# Patient Record
Sex: Female | Born: 1950 | Race: White | Hispanic: No | Smoking: Former smoker
Health system: Southern US, Community
[De-identification: ages and names within clinical notes are randomized; demographics above are authoritative.]

## PROBLEM LIST (undated history)

## (undated) DIAGNOSIS — G43909 Migraine, unspecified, not intractable, without status migrainosus: Secondary | ICD-10-CM

## (undated) DIAGNOSIS — J189 Pneumonia, unspecified organism: Secondary | ICD-10-CM

## (undated) DIAGNOSIS — M519 Unspecified thoracic, thoracolumbar and lumbosacral intervertebral disc disorder: Secondary | ICD-10-CM

## (undated) DIAGNOSIS — E785 Hyperlipidemia, unspecified: Secondary | ICD-10-CM

## (undated) DIAGNOSIS — E039 Hypothyroidism, unspecified: Secondary | ICD-10-CM

## (undated) DIAGNOSIS — E079 Disorder of thyroid, unspecified: Secondary | ICD-10-CM

## (undated) DIAGNOSIS — E063 Autoimmune thyroiditis: Secondary | ICD-10-CM

## (undated) DIAGNOSIS — D649 Anemia, unspecified: Secondary | ICD-10-CM

## (undated) HISTORY — PX: BREAST BIOPSY: SHX20

## (undated) HISTORY — DX: Disorder of thyroid, unspecified: E07.9

## (undated) HISTORY — PX: DIAGNOSTIC LAPAROSCOPY: SUR761

## (undated) HISTORY — DX: Hyperlipidemia, unspecified: E78.5

## (undated) HISTORY — PX: BREAST EXCISIONAL BIOPSY: SUR124

## (undated) HISTORY — DX: Autoimmune thyroiditis: E06.3

## (undated) HISTORY — PX: COLONOSCOPY: SHX5424

---

## 1995-09-07 HISTORY — PX: OTHER SURGICAL HISTORY: SHX169

## 2002-12-27 ENCOUNTER — Encounter: Payer: Self-pay | Admitting: Family Medicine

## 2002-12-27 ENCOUNTER — Ambulatory Visit (HOSPITAL_COMMUNITY): Admission: RE | Admit: 2002-12-27 | Discharge: 2002-12-27 | Payer: Self-pay | Admitting: Family Medicine

## 2003-09-12 ENCOUNTER — Other Ambulatory Visit: Admission: RE | Admit: 2003-09-12 | Discharge: 2003-09-12 | Payer: Self-pay | Admitting: *Deleted

## 2003-09-30 ENCOUNTER — Ambulatory Visit (HOSPITAL_COMMUNITY): Admission: RE | Admit: 2003-09-30 | Discharge: 2003-09-30 | Payer: Self-pay | Admitting: Neurology

## 2008-12-10 ENCOUNTER — Encounter: Admission: RE | Admit: 2008-12-10 | Discharge: 2008-12-10 | Payer: Self-pay | Admitting: Neurology

## 2010-01-20 ENCOUNTER — Encounter: Admission: RE | Admit: 2010-01-20 | Discharge: 2010-01-20 | Payer: Self-pay | Admitting: Family Medicine

## 2012-03-02 ENCOUNTER — Encounter: Payer: Self-pay | Admitting: Internal Medicine

## 2012-03-02 ENCOUNTER — Ambulatory Visit (INDEPENDENT_AMBULATORY_CARE_PROVIDER_SITE_OTHER): Payer: 59 | Admitting: Internal Medicine

## 2012-03-02 VITALS — BP 108/62 | HR 63 | Temp 98.4°F | Ht 62.25 in | Wt 162.0 lb

## 2012-03-02 DIAGNOSIS — E785 Hyperlipidemia, unspecified: Secondary | ICD-10-CM

## 2012-03-02 DIAGNOSIS — Z8669 Personal history of other diseases of the nervous system and sense organs: Secondary | ICD-10-CM

## 2012-03-02 DIAGNOSIS — J309 Allergic rhinitis, unspecified: Secondary | ICD-10-CM | POA: Insufficient documentation

## 2012-03-02 DIAGNOSIS — E039 Hypothyroidism, unspecified: Secondary | ICD-10-CM | POA: Insufficient documentation

## 2012-03-02 MED ORDER — PROPRANOLOL HCL ER BEADS 80 MG PO CP24: 80.0000 mg | ORAL_CAPSULE | Freq: Every day | ORAL | Status: DC

## 2012-03-02 MED ORDER — LEVOTHYROXINE SODIUM 112 MCG PO TABS: 112.0000 ug | ORAL_TABLET | Freq: Every day | ORAL | Status: DC

## 2012-03-02 MED ORDER — FROVATRIPTAN SUCCINATE 2.5 MG PO TABS: 2.5000 mg | ORAL_TABLET | ORAL | Status: DC | PRN

## 2012-03-02 NOTE — Progress Notes (Signed)
  Subjective:    Patient ID: Nancy Anthony, female    DOB: [birth year], 61 y.o.   MRN: [id number]  HPI  61 year old patient who is seen today to establish with this practice. She has changed insurance plans and her former physician, Dr. Laurann Montana no longer participates in her plan. She's had a recent physical exam with lab.   Medical problems include hypothyroidism. Cytomel has recent added to her present regimen due to an apparent slightly low free T3. In spite of a normal TSH she still has some cold intolerance dry skin and fatigue. She had Hashimoto's thyroiditis at age 41. She also has a history of dyslipidemia;  she has tried both Lipitor and simvastatin in the past. She feels that this has resulted in chronic muscle fatigue She is followed by Dr. Vela Prose for migraine headaches. These have improved since postmenopausal. Past surgical history is unremarkable did have a breast biopsy 1979. Did have outpatient surgery to remove a cyst in the left preauricular area in the past Social history married 45 year old daughter. Nonsmoker former Runner, broadcasting/film/video presently retired Family history father died age 6 of an MI paternal grandfather lived to his 68s mother recent died age 78 of complications of senile dementia paternal grandfather lived to 47 And these has had breast cancer one brother died of complications of prostate cancer area of he also had hypercholesterolemia and carotid artery disease    Review of Systems  Constitutional: Negative for fever, appetite change, fatigue and unexpected weight change.  HENT: Negative for hearing loss, ear pain, nosebleeds, congestion, sore throat, mouth sores, trouble swallowing, neck stiffness, dental problem, voice change, sinus pressure and tinnitus.   Eyes: Negative for photophobia, pain, redness and visual disturbance.  Respiratory: Negative for cough, chest tightness and shortness of breath.   Cardiovascular: Negative for chest pain, palpitations and leg  swelling.  Gastrointestinal: Negative for nausea, vomiting, abdominal pain, diarrhea, constipation, blood in stool, abdominal distention and rectal pain.  Genitourinary: Negative for dysuria, urgency, frequency, hematuria, flank pain, vaginal bleeding, vaginal discharge, difficulty urinating, genital sores, vaginal pain, menstrual problem and pelvic pain.  Musculoskeletal: Positive for myalgias. Negative for back pain and arthralgias.  Skin: Negative for rash.  Neurological: Positive for headaches. Negative for dizziness, syncope, speech difficulty, weakness, light-headedness and numbness.  Hematological: Negative for adenopathy. Does not bruise/bleed easily.  Psychiatric/Behavioral: Negative for suicidal ideas, behavioral problems, self-injury, dysphoric mood and agitation. The patient is not nervous/anxious.        Objective:   Physical Exam  Constitutional: She appears well-developed and well-nourished. No distress.       Slightly overweight with weight 162 Blood pressure 108/62  Clinical exam declines since she has had a recent exam by her former  PCP          Assessment & Plan:   Hypothyroidism. Clinically improved on combination therapy History dyslipidemia Migraine headaches stable  Patient will return in one year or as needed. All medications refilled

## 2012-03-02 NOTE — Patient Instructions (Addendum)
It is important that you exercise regularly, at least 20 minutes 3 to 4 times per week.  If you develop chest pain or shortness of breath seek  medical attention.  Return in one year for follow-up   

## 2012-04-20 ENCOUNTER — Other Ambulatory Visit: Payer: Self-pay

## 2012-04-20 MED ORDER — LIOTHYRONINE SODIUM 5 MCG PO TABS: 5.0000 ug | ORAL_TABLET | Freq: Every day | ORAL | Status: DC

## 2012-10-11 ENCOUNTER — Other Ambulatory Visit: Payer: Self-pay | Admitting: Internal Medicine

## 2013-03-23 ENCOUNTER — Other Ambulatory Visit: Payer: Self-pay | Admitting: Internal Medicine

## 2013-04-19 ENCOUNTER — Other Ambulatory Visit: Payer: Self-pay | Admitting: *Deleted

## 2013-04-19 MED ORDER — LIOTHYRONINE SODIUM 5 MCG PO TABS: ORAL_TABLET | ORAL | Status: DC

## 2013-05-30 ENCOUNTER — Other Ambulatory Visit: Payer: Self-pay | Admitting: Internal Medicine

## 2013-07-02 ENCOUNTER — Encounter: Payer: Self-pay | Admitting: Internal Medicine

## 2013-07-02 ENCOUNTER — Ambulatory Visit (INDEPENDENT_AMBULATORY_CARE_PROVIDER_SITE_OTHER): Payer: BC Managed Care – PPO | Admitting: Internal Medicine

## 2013-07-02 VITALS — BP 122/68 | HR 61 | Temp 98.4°F | Resp 10 | Ht 62.5 in | Wt 172.6 lb

## 2013-07-02 DIAGNOSIS — E063 Autoimmune thyroiditis: Secondary | ICD-10-CM

## 2013-07-02 LAB — T3, FREE: T3, Free: 2.3 pg/mL (ref 2.3–4.2)

## 2013-07-02 LAB — T4, FREE: Free T4: 1.28 ng/dL (ref 0.60–1.60)

## 2013-07-02 NOTE — Patient Instructions (Signed)
Please return in 6 months. Stop at the lab. If results abnormal >> return in 6 weeks to recheck tests.  Please join MyChart.

## 2013-07-02 NOTE — Progress Notes (Signed)
Patient ID: Nancy Anthony, female   DOB: 10-12-1950, 62 y.o.   MRN: [id number]   HPI  Nancy Anthony is a 62 y.o.-year-old female, referred by her PCP, Dr.Kwiatkowski, for evaluation for hypothyroidism.  Pt. has been dx with hypothyroidism in 1968; initially started on Synthroid (brand name) at 300 mg >> then decreased to 250 mcg daily, then slowly decreased to 112 mcg x 10 years, but last 2 years >> cold intolerance, fatigue, despite normal tests. She used Cytomel x 1 year (did not help) >> stopped 6 weeks ago. Per Dr Vernon Prey note, this was started 2/2 a slightly low free T3.  She takes the Synthroid >> at night, with water, >2-3h after dinner (no after dinner snacks). No iron, calcium, PPI, MVI.  Pt did not have thyroid tests for 1.5 year, but they have been normal.   Pt denies feeling nodules in neck, hoarseness, dysphagia/odynophagia, SOB with lying down;   She c/o: - + fatigue - + weight gain: last 2 years - 10 lbs - + some constipation - + dry skin - no hair falling - no depression or anxiety - + HAs -   She has a + FH of thyroid disorders in: mother, father and many other family members. No FH of thyroid cancer. No h/o radiation tx to head or neck.  I reviewed her chart and she also has a history of migraines - on Triptan, Propranolol  ROS: Constitutional: see HPI, also hot flushes Eyes: no blurry vision, no xerophthalmia ENT: no sore throat, no nodules palpated in throat, no dysphagia/odynophagia, no hoarseness Cardiovascular: no CP/SOB/palpitations/leg swelling Respiratory: no cough/SOB Gastrointestinal: no N/V/D/C Musculoskeletal: no muscle/+ joint aches (hip, shoulder) Skin: no rashes, + itching Neurological: no tremors/numbness/tingling/dizziness, + HA Psychiatric: no depression/anxiety  Past Medical History  Diagnosis Date  . Thyroid disease   . Hyperlipidemia    Past Surgical History  Procedure Laterality Date  . Breast biopsy     History    Social History  . Marital Status: Married    Spouse Name: N/A    Number of Children: 1   Occupational History  . Retired Runner, broadcasting/film/video   Social History Main Topics  . Smoking status: Former Smoker    Types: Cigarettes  . Smokeless tobacco: Never Used  . Alcohol Use: No  . Drug Use: No  . Sexual Activity: Yes    Partners: Male   Social History Narrative   Regular exercise: no   Caffeine use: cup of coffee in them AM   Current Outpatient Prescriptions on File Prior to Visit  Medication Sig Dispense Refill  . aspirin 81 MG tablet Take 81 mg by mouth daily.      . frovatriptan (FROVA) 2.5 MG tablet Take 1 tablet (2.5 mg total) by mouth as needed. If recurs, may repeat after 2 hours. Max of 3 tabs in 24 hours.  10 tablet  6  . SYNTHROID 112 MCG tablet TAKE ONE TABLET BY MOUTH EVERY DAY  90 tablet  0  . liothyronine (CYTOMEL) 5 MCG tablet TAKE ONE TABLET BY MOUTH EVERY DAY  30 tablet  0   No current facility-administered medications on file prior to visit.   Allergies  Allergen Reactions  . Codeine     Headache, shakes & vomiting  . Statins     Muscle weakness   History reviewed. No pertinent family history.  PE: BP 122/68  Pulse 61  Temp(Src) 98.4 F (36.9 C) (Oral)  Resp 10  Ht 5' 2.5" (1.588  m)  Wt 172 lb 9.6 oz (78.291 kg)  BMI 31.05 kg/m2  SpO2 95% Wt Readings from Last 3 Encounters:  07/02/13 172 lb 9.6 oz (78.291 kg)  03/02/12 162 lb (73.483 kg)   Constitutional: overweight, in NAD Eyes: PERRLA, EOMI, no exophthalmos ENT: moist mucous membranes, no thyromegaly, no cervical lymphadenopathy Cardiovascular: RRR, No MRG Respiratory: CTA B Gastrointestinal: abdomen soft, NT, ND, BS+ Musculoskeletal: no deformities, strength intact in all 4 Skin: moist, warm, no rashes Neurological: no tremor with outstretched hands, DTR normal in all 4  ASSESSMENT: 1. Hypothyroidism - on Synthroid 112 mcg taken at night  PLAN:  1. Patient with long-standing  hypothyroidism, on Synthroid therapy. She appears euthyroid. - I explained that fatigue, weight gain and dry skin are nonspecific sxs and they can be seen in many other conditions. If TSh normal, would continue the same dose of Synthroid.  - She tried Cytomel low dose >> did not help. This was apparently started for a low fT3, but we discussed that Propranolol can decrease the T3 >> she will see if maybe the dose can be decreased (as the beta blocker can cause fatigue, weight gain and also the lower T3). - Upon her questioning, we discussed about positive and negative aspects of using Armour thyroid. I underlined the fact that:  Armour is purified from porcine thyroid glands, which is not without risk for contaminants  Also, the ratio between T4 and T3 in Armour is physiologic for pigs, not for humans.  The short half life of T3 can cause fluctuations in blood levels, which can result in mood swings and heart rhythm abnormalities.  The concentration of the active substances (T4 and T3) can be expected to vary between different Armour lots, which can cause variation in the thyroid function tests.  - We discussed about correct intake of levothyroxine, fasting, with water, separated by at least 30 minutes from a meal. It is OK to take it at night if she takes it long enough after dinner. - She does not appear to have a goiter, thyroid nodules, or neck compression symptoms - we'll check thyroid tests today: TSH, free T4, free t3 - If these are abnormal, she will need to return in 6-8 weeks for repeat labs - If these are normal, I will see her back in 6 months  Office Visit on 07/02/2013  Component Date Value Range Status  . TSH 07/02/2013 0.20* 0.35 - 5.50 uIU/mL Final  . T3, Free 07/02/2013 2.3  2.3 - 4.2 pg/mL Final  . Free T4 07/02/2013 1.28  0.60 - 1.60 ng/dL Final   Since we moved the Synthroid in am >> will continue current dose for now and recheck tests in 6 weeks >> if TSH still low >> will  decrease Synthroid dose.

## 2013-07-12 ENCOUNTER — Other Ambulatory Visit: Payer: Self-pay

## 2013-09-04 ENCOUNTER — Other Ambulatory Visit: Payer: Self-pay | Admitting: Internal Medicine

## 2013-09-04 ENCOUNTER — Other Ambulatory Visit (INDEPENDENT_AMBULATORY_CARE_PROVIDER_SITE_OTHER): Payer: BC Managed Care – PPO

## 2013-09-04 DIAGNOSIS — E039 Hypothyroidism, unspecified: Secondary | ICD-10-CM

## 2013-09-04 DIAGNOSIS — E063 Autoimmune thyroiditis: Secondary | ICD-10-CM

## 2013-09-04 LAB — TSH: TSH: 0.14 u[IU]/mL — ABNORMAL LOW (ref 0.35–5.50)

## 2013-09-04 LAB — T4, FREE: Free T4: 1.02 ng/dL (ref 0.60–1.60)

## 2013-09-04 MED ORDER — SYNTHROID 100 MCG PO TABS: 100.0000 ug | ORAL_TABLET | Freq: Every day | ORAL | Status: DC

## 2014-05-21 ENCOUNTER — Other Ambulatory Visit: Payer: Self-pay | Admitting: *Deleted

## 2014-05-21 MED ORDER — SYNTHROID 100 MCG PO TABS: 100.0000 ug | ORAL_TABLET | Freq: Every day | ORAL | Status: DC

## 2014-05-29 ENCOUNTER — Emergency Department (HOSPITAL_COMMUNITY)
Admission: EM | Admit: 2014-05-29 | Discharge: 2014-05-30 | Disposition: A | Discharge status: Home / Self Care | Payer: BC Managed Care – PPO | Attending: Emergency Medicine | Admitting: Emergency Medicine

## 2014-05-29 ENCOUNTER — Encounter (HOSPITAL_COMMUNITY): Payer: Self-pay | Admitting: Emergency Medicine

## 2014-05-29 DIAGNOSIS — S6990XA Unspecified injury of unspecified wrist, hand and finger(s), initial encounter: Secondary | ICD-10-CM | POA: Diagnosis present

## 2014-05-29 DIAGNOSIS — Z79899 Other long term (current) drug therapy: Secondary | ICD-10-CM | POA: Insufficient documentation

## 2014-05-29 DIAGNOSIS — Z7982 Long term (current) use of aspirin: Secondary | ICD-10-CM | POA: Diagnosis not present

## 2014-05-29 DIAGNOSIS — E079 Disorder of thyroid, unspecified: Secondary | ICD-10-CM | POA: Insufficient documentation

## 2014-05-29 DIAGNOSIS — S61209A Unspecified open wound of unspecified finger without damage to nail, initial encounter: Secondary | ICD-10-CM | POA: Diagnosis not present

## 2014-05-29 DIAGNOSIS — Y9389 Activity, other specified: Secondary | ICD-10-CM | POA: Diagnosis not present

## 2014-05-29 DIAGNOSIS — Z87891 Personal history of nicotine dependence: Secondary | ICD-10-CM | POA: Insufficient documentation

## 2014-05-29 DIAGNOSIS — S6980XA Other specified injuries of unspecified wrist, hand and finger(s), initial encounter: Secondary | ICD-10-CM | POA: Insufficient documentation

## 2014-05-29 DIAGNOSIS — G43909 Migraine, unspecified, not intractable, without status migrainosus: Secondary | ICD-10-CM | POA: Insufficient documentation

## 2014-05-29 DIAGNOSIS — Y9289 Other specified places as the place of occurrence of the external cause: Secondary | ICD-10-CM | POA: Insufficient documentation

## 2014-05-29 DIAGNOSIS — W230XXA Caught, crushed, jammed, or pinched between moving objects, initial encounter: Secondary | ICD-10-CM | POA: Insufficient documentation

## 2014-05-29 DIAGNOSIS — S61213A Laceration without foreign body of left middle finger without damage to nail, initial encounter: Secondary | ICD-10-CM

## 2014-05-29 HISTORY — DX: Migraine, unspecified, not intractable, without status migrainosus: G43.909

## 2014-05-29 NOTE — ED Notes (Signed)
Pt. presents with laceration at left distal middle finger approx. 1/2 inch sustained this evening at home , accidentally caught it between sliding door . Dressing applied at triage .

## 2014-05-30 MED ORDER — LIDOCAINE HCL (PF) 1 % IJ SOLN
5.0000 mL | Freq: Once | INTRAMUSCULAR | Status: DC
  Filled 2014-05-30: qty 5

## 2014-05-30 NOTE — ED Provider Notes (Signed)
Medical screening examination/treatment/procedure(s) were performed by non-physician practitioner and as supervising physician I was immediately available for consultation/collaboration.  Ernestina Patches, MD 05/30/14 2013

## 2014-05-30 NOTE — Discharge Instructions (Signed)
You may remove your bandage in 48 hours.  At that point, area can be gently cleaned with soap and water.  Avoid picking at wound glue. Do not soak or submerge finger in water such as a bath or while doing dishes. See below for further instructions.

## 2014-05-30 NOTE — ED Provider Notes (Signed)
CSN: [id number]     Arrival date & time 05/29/14  2320 History   First MD Initiated Contact with Patient 05/30/14 0000     Chief Complaint  Patient presents with  . Finger Injury     (Consider location/radiation/quality/duration/timing/severity/associated sxs/prior Treatment) HPI Pt is a 63yo female presenting to ED with c/o laceration to the distal aspect of her left middle finger, 1cm long.  Reports getting finger caught between sliding door which resulted in immediate pain and bleeding. Pain is aching, 8/10, worse with movement.  Bleeding controlled with pressure. Denies pain or damage to nail.  Reports last tetanus was 4-5 years ago.  Pt is not on blood thinners. No hx of diabetes. Denies any other injuries.    Past Medical History  Diagnosis Date  . Thyroid disease   . Hyperlipidemia   . Migraine    Past Surgical History  Procedure Laterality Date  . Breast biopsy     No family history on file. History  Substance Use Topics  . Smoking status: Former Smoker    Types: Cigarettes  . Smokeless tobacco: Never Used  . Alcohol Use: No   OB History   Grav Para Term Preterm Abortions TAB SAB Ect Mult Living                 Review of Systems  Musculoskeletal: Negative for arthralgias, joint swelling and myalgias.  Skin: Positive for wound ( distal left middle finger). Negative for color change.  All other systems reviewed and are negative.     Allergies  Codeine and Statins  Home Medications   Prior to Admission medications   Medication Sig Start Date End Date Taking? Authorizing Provider  aspirin 81 MG tablet Take 81 mg by mouth daily.    Historical Provider, MD  frovatriptan (FROVA) 2.5 MG tablet Take 1 tablet (2.5 mg total) by mouth as needed. If recurs, may repeat after 2 hours. Max of 3 tabs in 24 hours. 03/02/12   Marletta Lor, MD  propranolol ER (INDERAL LA) 80 MG 24 hr capsule Take 80 mg by mouth daily.  06/15/13   Historical Provider, MD  SYNTHROID 100  MCG tablet Take 1 tablet (100 mcg total) by mouth daily before breakfast. 05/21/14   Philemon Kingdom, MD   BP 139/69  Pulse 66  Temp(Src) 98.8 F (37.1 C) (Oral)  Resp 20  SpO2 98% Physical Exam  Nursing note and vitals reviewed. Constitutional: She is oriented to person, place, and time. She appears well-developed and well-nourished.  HENT:  Head: Normocephalic and atraumatic.  Eyes: EOM are normal.  Neck: Normal range of motion.  Cardiovascular: Normal rate.   Left middle finger: cap refill <3 seconds  Pulmonary/Chest: Effort normal.  Musculoskeletal: Normal range of motion.  Left middle finger: FROM, no deformity.  Neurological: She is alert and oriented to person, place, and time.  Sensation in tact  Skin: Skin is warm and dry.  Distal left middle finger, volar aspect: 1cm superficial laceration. Bleeding controlled.   Psychiatric: She has a normal mood and affect. Her behavior is normal.    ED Course  Procedures  The wound is cleansed, debrided of foreign material as much as possible, Dermabond applied and dressed. The patient is alerted to watch for any signs of infection (redness, pus, pain, increased swelling or fever) and call if such occurs. Home wound care instructions are provided. Tetanus vaccination status reviewed: UTD   Labs Review Labs Reviewed - No data to display  Imaging Review No results found.   EKG Interpretation None      MDM   Final diagnoses:  Laceration of left middle finger w/o foreign body w/o damage to nail, initial encounter   Pt presenting to ED with superficial laceration to distal aspect of left middle finger.  FROM, finger ins neurovascularly in tact. Not concerned for fracture. Wound cleansed and closed with Dermabond. Pressure dressing placed.  Home care instructions provided.  Advised to f/u in 4 days for wound recheck.  Return precautions provided. Pt verbalized understanding and agreement with tx plan.   Noland Fordyce,  PA-C 05/30/14 [place], PA-C 05/30/14 0110

## 2014-06-21 ENCOUNTER — Other Ambulatory Visit (INDEPENDENT_AMBULATORY_CARE_PROVIDER_SITE_OTHER): Payer: BC Managed Care – PPO

## 2014-06-21 DIAGNOSIS — E039 Hypothyroidism, unspecified: Secondary | ICD-10-CM

## 2014-06-21 LAB — T4, FREE: Free T4: 1.22 ng/dL (ref 0.60–1.60)

## 2014-06-21 LAB — TSH: TSH: 0.54 u[IU]/mL (ref 0.35–4.50)

## 2014-06-23 ENCOUNTER — Other Ambulatory Visit: Payer: Self-pay | Admitting: Internal Medicine

## 2014-06-24 ENCOUNTER — Other Ambulatory Visit: Payer: Self-pay | Admitting: *Deleted

## 2014-06-24 ENCOUNTER — Telehealth: Payer: Self-pay | Admitting: Internal Medicine

## 2014-06-24 MED ORDER — SYNTHROID 100 MCG PO TABS: 100.0000 ug | ORAL_TABLET | Freq: Every day | ORAL | Status: DC

## 2014-06-24 NOTE — Telephone Encounter (Signed)
Pt needs synthryoid called into the [facility] on [address] # [phone]

## 2014-07-24 ENCOUNTER — Other Ambulatory Visit: Payer: Self-pay | Admitting: Internal Medicine

## 2014-07-24 NOTE — Telephone Encounter (Signed)
Needs OV for further refills

## 2014-09-18 ENCOUNTER — Other Ambulatory Visit: Payer: Self-pay | Admitting: Internal Medicine

## 2014-10-31 ENCOUNTER — Other Ambulatory Visit: Payer: Self-pay | Admitting: Family Medicine

## 2014-10-31 DIAGNOSIS — R2232 Localized swelling, mass and lump, left upper limb: Secondary | ICD-10-CM

## 2014-11-08 ENCOUNTER — Other Ambulatory Visit: Payer: Self-pay | Admitting: Family Medicine

## 2014-11-08 ENCOUNTER — Ambulatory Visit
Admission: RE | Admit: 2014-11-08 | Discharge: 2014-11-08 | Disposition: A | Discharge status: Home / Self Care | Payer: BLUE CROSS/BLUE SHIELD | Source: Ambulatory Visit | Attending: Family Medicine | Admitting: Family Medicine

## 2014-11-08 DIAGNOSIS — R2232 Localized swelling, mass and lump, left upper limb: Secondary | ICD-10-CM

## 2015-03-25 ENCOUNTER — Other Ambulatory Visit: Payer: Self-pay | Admitting: Obstetrics and Gynecology

## 2015-03-25 DIAGNOSIS — R928 Other abnormal and inconclusive findings on diagnostic imaging of breast: Secondary | ICD-10-CM

## 2015-03-31 ENCOUNTER — Ambulatory Visit
Admission: RE | Admit: 2015-03-31 | Discharge: 2015-03-31 | Disposition: A | Discharge status: Home / Self Care | Payer: BLUE CROSS/BLUE SHIELD | Source: Ambulatory Visit | Attending: Obstetrics and Gynecology | Admitting: Obstetrics and Gynecology

## 2015-03-31 DIAGNOSIS — R928 Other abnormal and inconclusive findings on diagnostic imaging of breast: Secondary | ICD-10-CM

## 2016-05-27 ENCOUNTER — Other Ambulatory Visit: Payer: Self-pay | Admitting: Obstetrics and Gynecology

## 2016-05-27 DIAGNOSIS — Z1231 Encounter for screening mammogram for malignant neoplasm of breast: Secondary | ICD-10-CM

## 2016-06-04 ENCOUNTER — Ambulatory Visit
Admission: RE | Admit: 2016-06-04 | Discharge: 2016-06-04 | Disposition: A | Discharge status: Home / Self Care | Payer: BLUE CROSS/BLUE SHIELD | Source: Ambulatory Visit | Attending: Obstetrics and Gynecology | Admitting: Obstetrics and Gynecology

## 2016-06-04 DIAGNOSIS — Z1231 Encounter for screening mammogram for malignant neoplasm of breast: Secondary | ICD-10-CM

## 2017-05-04 ENCOUNTER — Emergency Department (HOSPITAL_COMMUNITY)
Admission: EM | Admit: 2017-05-04 | Discharge: 2017-05-05 | Disposition: A | Discharge status: Home / Self Care | Payer: BLUE CROSS/BLUE SHIELD | Attending: Emergency Medicine | Admitting: Emergency Medicine

## 2017-05-04 ENCOUNTER — Encounter (HOSPITAL_COMMUNITY): Payer: Self-pay | Admitting: Emergency Medicine

## 2017-05-04 DIAGNOSIS — G43801 Other migraine, not intractable, with status migrainosus: Secondary | ICD-10-CM

## 2017-05-04 DIAGNOSIS — R51 Headache: Secondary | ICD-10-CM | POA: Diagnosis present

## 2017-05-04 MED ORDER — ONDANSETRON 4 MG PO TBDP
4.0000 mg | ORAL_TABLET | Freq: Once | ORAL | Status: AC | PRN
  Administered 2017-05-04: 4 mg via ORAL
  Filled 2017-05-04: qty 1

## 2017-05-04 NOTE — ED Triage Notes (Signed)
Pt states that she has a hx of migraines and has had one x 5 days. Tried at home meds but cannot get relief. Alert and oriented. N/V. Light sensitivity. Neuro intact.

## 2017-05-04 NOTE — ED Notes (Signed)
Bed: WTR7 Expected date:  Expected time:  Means of arrival:  Comments: 

## 2017-05-04 NOTE — ED Notes (Signed)
Pt stated that she wanted to just receive an IM injection and go home.

## 2017-05-05 MED ORDER — KETOROLAC TROMETHAMINE 60 MG/2ML IM SOLN
60.0000 mg | Freq: Once | INTRAMUSCULAR | Status: AC
  Administered 2017-05-05: 60 mg via INTRAMUSCULAR
  Filled 2017-05-05: qty 2

## 2017-05-05 MED ORDER — DIPHENHYDRAMINE HCL 50 MG/ML IJ SOLN
25.0000 mg | Freq: Once | INTRAMUSCULAR | Status: AC
  Administered 2017-05-05: 25 mg via INTRAMUSCULAR
  Filled 2017-05-05: qty 1

## 2017-05-05 MED ORDER — DEXAMETHASONE SODIUM PHOSPHATE 10 MG/ML IJ SOLN
10.0000 mg | Freq: Once | INTRAMUSCULAR | Status: AC
  Administered 2017-05-05: 10 mg via INTRAMUSCULAR
  Filled 2017-05-05: qty 1

## 2017-05-05 MED ORDER — PROCHLORPERAZINE EDISYLATE 5 MG/ML IJ SOLN
10.0000 mg | Freq: Once | INTRAMUSCULAR | Status: AC
  Administered 2017-05-05: 10 mg via INTRAMUSCULAR
  Filled 2017-05-05: qty 2

## 2017-05-05 NOTE — ED Provider Notes (Signed)
[facility] DEPT Provider Note   CSN: [id number] Arrival date & time: 05/04/17  1826     History   Chief Complaint Chief Complaint  Patient presents with  . Migraine    HPI Nancy Anthony is a 66 y.o. female.  Patient presents to the emergency department for evaluation of headache. Patient reports a history of recurrent migraine. Her home medications normally take care of her migraines, but she has been experiencing this particular headache for 5 days. She reports a global throbbing headache with nausea, vomiting, light sensitivity. This is similar to previous migraines. She has not had any fever, no neck pain or stiffness.      Past Medical History:  Diagnosis Date  . Hyperlipidemia   . Migraine   . Thyroid disease     Patient Active Problem List   Diagnosis Date Noted  . Hypothyroidism 03/02/2012  . Hx of migraine headaches 03/02/2012  . Allergic rhinitis 03/02/2012  . Dyslipidemia 03/02/2012    Past Surgical History:  Procedure Laterality Date  . BREAST BIOPSY      OB History    No data available       Home Medications    Prior to Admission medications   Medication Sig Start Date End Date Taking? Authorizing Provider  aspirin 81 MG tablet Take 81 mg by mouth daily.    [provider]  frovatriptan (FROVA) 2.5 MG tablet Take 1 tablet (2.5 mg total) by mouth as needed. If recurs, may repeat after 2 hours. Max of 3 tabs in 24 hours. 03/02/12   Marletta Lor, MD  propranolol ER (INDERAL LA) 80 MG 24 hr capsule Take 80 mg by mouth daily.  06/15/13   [provider]  SYNTHROID 100 MCG tablet TAKE ONE TABLET BY MOUTH ONCE DAILY BEFORE BREAKFAST 09/18/14   Philemon Kingdom, MD    Family History History reviewed. No pertinent family history.  Social History Social History  Substance Use Topics  . Smoking status: Former Smoker    Types: Cigarettes  . Smokeless tobacco: Never Used  . Alcohol use No     Allergies   Codeine;  Statins; and Sulfa antibiotics   Review of Systems Review of Systems  Eyes: Positive for photophobia.  Gastrointestinal: Positive for nausea and vomiting.  Neurological: Positive for headaches.  All other systems reviewed and are negative.    Physical Exam Updated Vital Signs BP (!) 157/72 (BP Location: Left Arm)   Pulse 84   Temp 98.1 F (36.7 C) (Oral)   Resp 18   SpO2 99%   Physical Exam  Constitutional: She is oriented to person, place, and time. She appears well-developed and well-nourished. No distress.  HENT:  Head: Normocephalic and atraumatic.  Right Ear: Hearing normal.  Left Ear: Hearing normal.  Nose: Nose normal.  Mouth/Throat: Oropharynx is clear and moist and mucous membranes are normal.  Eyes: Pupils are equal, round, and reactive to light. Conjunctivae and EOM are normal.  Neck: Normal range of motion. Neck supple.  Cardiovascular: Regular rhythm, S1 normal and S2 normal.  Exam reveals no gallop and no friction rub.   No murmur heard. Pulmonary/Chest: Effort normal and breath sounds normal. No respiratory distress. She exhibits no tenderness.  Abdominal: Soft. Normal appearance and bowel sounds are normal. There is no hepatosplenomegaly. There is no tenderness. There is no rebound, no guarding, no tenderness at McBurney's point and negative Murphy's sign. No hernia.  Musculoskeletal: Normal range of motion.  Neurological: She is alert and  oriented to person, place, and time. She has normal strength. No cranial nerve deficit or sensory deficit. Coordination normal. GCS eye subscore is 4. GCS verbal subscore is 5. GCS motor subscore is 6.  Skin: Skin is warm, dry and intact. No rash noted. No cyanosis.  Psychiatric: She has a normal mood and affect. Her speech is normal and behavior is normal. Thought content normal.  Nursing note and vitals reviewed.    ED Treatments / Results  Labs (all labs ordered are listed, but only abnormal results are  displayed) Labs Reviewed - No data to display  EKG  EKG Interpretation None       Radiology No results found.  Procedures Procedures (including critical care time)  Medications Ordered in ED Medications  ondansetron (ZOFRAN-ODT) disintegrating tablet 4 mg (4 mg Oral Given 05/04/17 1854)  ketorolac (TORADOL) injection 60 mg (60 mg Intramuscular Given 05/05/17 0020)  prochlorperazine (COMPAZINE) injection 10 mg (10 mg Intramuscular Given 05/05/17 0022)  dexamethasone (DECADRON) injection 10 mg (10 mg Intramuscular Given 05/05/17 0017)  diphenhydrAMINE (BENADRYL) injection 25 mg (25 mg Intramuscular Given 05/05/17 0016)     Initial Impression / Assessment and Plan / ED Course  I have reviewed the triage vital signs and the nursing notes.  Pertinent labs & imaging results that were available during my care of the patient were reviewed by me and considered in my medical decision making (see chart for details).     Patient presents to the emergency department for evaluation of migraine headache. Patient is currently experiencing a headache that is similar to previous migraines. Patient does not have any unusual features compared to previous migraines. Patient has normal neurologic examination. There are no unusual features, such as unusual intensity or sudden onset. As this headache is similar to previous migraines, there is no concern for subarachnoid hemorrhage or other etiology. Patient therefore does not require imaging. Patient treated as migraine headache.  Final Clinical Impressions(s) / ED Diagnoses   Final diagnoses:  Other migraine with status migrainosus, not intractable    New Prescriptions New Prescriptions   No medications on file     Orpah Greek, MD 05/05/17 0030

## 2017-05-05 NOTE — ED Notes (Signed)
Pt ambulatory and independent at discharge.  Verbalized understanding of discharge instructions 

## 2017-05-14 DIAGNOSIS — R69 Illness, unspecified: Secondary | ICD-10-CM | POA: Diagnosis not present

## 2017-05-19 DIAGNOSIS — G43719 Chronic migraine without aura, intractable, without status migrainosus: Secondary | ICD-10-CM | POA: Diagnosis not present

## 2017-05-19 DIAGNOSIS — Z79899 Other long term (current) drug therapy: Secondary | ICD-10-CM | POA: Diagnosis not present

## 2017-05-19 DIAGNOSIS — R51 Headache: Secondary | ICD-10-CM | POA: Diagnosis not present

## 2017-05-19 DIAGNOSIS — Z049 Encounter for examination and observation for unspecified reason: Secondary | ICD-10-CM | POA: Diagnosis not present

## 2017-05-23 DIAGNOSIS — M791 Myalgia: Secondary | ICD-10-CM | POA: Diagnosis not present

## 2017-05-23 DIAGNOSIS — R51 Headache: Secondary | ICD-10-CM | POA: Diagnosis not present

## 2017-05-23 DIAGNOSIS — M542 Cervicalgia: Secondary | ICD-10-CM | POA: Diagnosis not present

## 2017-05-23 DIAGNOSIS — G43719 Chronic migraine without aura, intractable, without status migrainosus: Secondary | ICD-10-CM | POA: Diagnosis not present

## 2017-05-30 DIAGNOSIS — G43909 Migraine, unspecified, not intractable, without status migrainosus: Secondary | ICD-10-CM | POA: Diagnosis not present

## 2017-05-31 DIAGNOSIS — R51 Headache: Secondary | ICD-10-CM | POA: Diagnosis not present

## 2017-05-31 DIAGNOSIS — M542 Cervicalgia: Secondary | ICD-10-CM | POA: Diagnosis not present

## 2017-05-31 DIAGNOSIS — G43719 Chronic migraine without aura, intractable, without status migrainosus: Secondary | ICD-10-CM | POA: Diagnosis not present

## 2017-05-31 DIAGNOSIS — M791 Myalgia: Secondary | ICD-10-CM | POA: Diagnosis not present

## 2017-06-06 DIAGNOSIS — M542 Cervicalgia: Secondary | ICD-10-CM | POA: Diagnosis not present

## 2017-06-06 DIAGNOSIS — R51 Headache: Secondary | ICD-10-CM | POA: Diagnosis not present

## 2017-06-06 DIAGNOSIS — M791 Myalgia, unspecified site: Secondary | ICD-10-CM | POA: Diagnosis not present

## 2017-06-06 DIAGNOSIS — G43719 Chronic migraine without aura, intractable, without status migrainosus: Secondary | ICD-10-CM | POA: Diagnosis not present

## 2017-06-20 DIAGNOSIS — R51 Headache: Secondary | ICD-10-CM | POA: Diagnosis not present

## 2017-06-20 DIAGNOSIS — M542 Cervicalgia: Secondary | ICD-10-CM | POA: Diagnosis not present

## 2017-06-20 DIAGNOSIS — M791 Myalgia, unspecified site: Secondary | ICD-10-CM | POA: Diagnosis not present

## 2017-06-20 DIAGNOSIS — G43719 Chronic migraine without aura, intractable, without status migrainosus: Secondary | ICD-10-CM | POA: Diagnosis not present

## 2017-07-04 DIAGNOSIS — M791 Myalgia, unspecified site: Secondary | ICD-10-CM | POA: Diagnosis not present

## 2017-07-04 DIAGNOSIS — G43719 Chronic migraine without aura, intractable, without status migrainosus: Secondary | ICD-10-CM | POA: Diagnosis not present

## 2017-07-04 DIAGNOSIS — R51 Headache: Secondary | ICD-10-CM | POA: Diagnosis not present

## 2017-07-04 DIAGNOSIS — M542 Cervicalgia: Secondary | ICD-10-CM | POA: Diagnosis not present

## 2017-07-05 DIAGNOSIS — M9902 Segmental and somatic dysfunction of thoracic region: Secondary | ICD-10-CM | POA: Diagnosis not present

## 2017-07-05 DIAGNOSIS — Z0000 Encounter for general adult medical examination without abnormal findings: Secondary | ICD-10-CM | POA: Diagnosis not present

## 2017-07-05 DIAGNOSIS — M9901 Segmental and somatic dysfunction of cervical region: Secondary | ICD-10-CM | POA: Diagnosis not present

## 2017-07-05 DIAGNOSIS — Z23 Encounter for immunization: Secondary | ICD-10-CM | POA: Diagnosis not present

## 2017-07-05 DIAGNOSIS — E559 Vitamin D deficiency, unspecified: Secondary | ICD-10-CM | POA: Diagnosis not present

## 2017-07-05 DIAGNOSIS — M9903 Segmental and somatic dysfunction of lumbar region: Secondary | ICD-10-CM | POA: Diagnosis not present

## 2017-07-05 DIAGNOSIS — E785 Hyperlipidemia, unspecified: Secondary | ICD-10-CM | POA: Diagnosis not present

## 2017-07-05 DIAGNOSIS — E039 Hypothyroidism, unspecified: Secondary | ICD-10-CM | POA: Diagnosis not present

## 2017-07-15 ENCOUNTER — Other Ambulatory Visit: Payer: Self-pay | Admitting: Obstetrics and Gynecology

## 2017-07-15 DIAGNOSIS — Z6826 Body mass index (BMI) 26.0-26.9, adult: Secondary | ICD-10-CM | POA: Diagnosis not present

## 2017-07-15 DIAGNOSIS — N958 Other specified menopausal and perimenopausal disorders: Secondary | ICD-10-CM | POA: Diagnosis not present

## 2017-07-15 DIAGNOSIS — Z139 Encounter for screening, unspecified: Secondary | ICD-10-CM

## 2017-07-15 DIAGNOSIS — M816 Localized osteoporosis [Lequesne]: Secondary | ICD-10-CM | POA: Diagnosis not present

## 2017-07-15 DIAGNOSIS — Z01419 Encounter for gynecological examination (general) (routine) without abnormal findings: Secondary | ICD-10-CM | POA: Diagnosis not present

## 2017-07-19 DIAGNOSIS — M542 Cervicalgia: Secondary | ICD-10-CM | POA: Diagnosis not present

## 2017-07-19 DIAGNOSIS — R51 Headache: Secondary | ICD-10-CM | POA: Diagnosis not present

## 2017-07-19 DIAGNOSIS — M791 Myalgia, unspecified site: Secondary | ICD-10-CM | POA: Diagnosis not present

## 2017-07-19 DIAGNOSIS — G43719 Chronic migraine without aura, intractable, without status migrainosus: Secondary | ICD-10-CM | POA: Diagnosis not present

## 2017-08-02 DIAGNOSIS — M542 Cervicalgia: Secondary | ICD-10-CM | POA: Diagnosis not present

## 2017-08-02 DIAGNOSIS — M791 Myalgia, unspecified site: Secondary | ICD-10-CM | POA: Diagnosis not present

## 2017-08-02 DIAGNOSIS — R51 Headache: Secondary | ICD-10-CM | POA: Diagnosis not present

## 2017-08-02 DIAGNOSIS — G43719 Chronic migraine without aura, intractable, without status migrainosus: Secondary | ICD-10-CM | POA: Diagnosis not present

## 2017-08-17 DIAGNOSIS — M791 Myalgia, unspecified site: Secondary | ICD-10-CM | POA: Diagnosis not present

## 2017-08-17 DIAGNOSIS — M542 Cervicalgia: Secondary | ICD-10-CM | POA: Diagnosis not present

## 2017-08-17 DIAGNOSIS — G518 Other disorders of facial nerve: Secondary | ICD-10-CM | POA: Diagnosis not present

## 2017-08-17 DIAGNOSIS — G43719 Chronic migraine without aura, intractable, without status migrainosus: Secondary | ICD-10-CM | POA: Diagnosis not present

## 2017-08-19 DIAGNOSIS — L570 Actinic keratosis: Secondary | ICD-10-CM | POA: Diagnosis not present

## 2017-08-19 DIAGNOSIS — L814 Other melanin hyperpigmentation: Secondary | ICD-10-CM | POA: Diagnosis not present

## 2017-08-22 ENCOUNTER — Ambulatory Visit
Admission: RE | Admit: 2017-08-22 | Discharge: 2017-08-22 | Disposition: A | Discharge status: Home / Self Care | Payer: BLUE CROSS/BLUE SHIELD | Source: Ambulatory Visit | Attending: Obstetrics and Gynecology | Admitting: Obstetrics and Gynecology

## 2017-08-22 DIAGNOSIS — Z1231 Encounter for screening mammogram for malignant neoplasm of breast: Secondary | ICD-10-CM | POA: Diagnosis not present

## 2017-08-22 DIAGNOSIS — Z139 Encounter for screening, unspecified: Secondary | ICD-10-CM

## 2017-08-23 ENCOUNTER — Other Ambulatory Visit: Payer: Self-pay | Admitting: Obstetrics and Gynecology

## 2017-08-23 DIAGNOSIS — R928 Other abnormal and inconclusive findings on diagnostic imaging of breast: Secondary | ICD-10-CM

## 2017-08-24 ENCOUNTER — Ambulatory Visit: Payer: Medicare HMO | Admitting: Neurology

## 2017-08-24 ENCOUNTER — Encounter: Payer: Self-pay | Admitting: Neurology

## 2017-08-24 DIAGNOSIS — G43711 Chronic migraine without aura, intractable, with status migrainosus: Secondary | ICD-10-CM | POA: Diagnosis not present

## 2017-08-24 NOTE — Progress Notes (Signed)
[facility]    Provider:  Dr Jaynee Eagles Referring Provider: Harlan Stains, MD Primary Care Physician:  Harlan Stains, MD  CC:  Migraines  HPI:  Nancy Anthony is a 66 y.o. female here as a referral from Dr. Dema Severin for migraine. PMHx migraine, HLD, thyroid disease (hypothyroidism). She is a patient of Dr. Catalina Gravel and he retired. She also gets botox. She had botox last Wednesday, she has had it twice now. She was at the ED in August and also has been to urgent care multiple times in the last few months.I n the last 30 days, she had 13 headache days and all were migrainous,2 days were severe.   no aura, no medication overuse. Before botox she had daily migraines, she has improved more than 50% in both severity and frequency. She has had 2 botox injections and doing well, interested in transitioning to our office. The migraines are on the right side, can last several days up to a week. She has throbbing, more centered in the forehead and around the eyes with associated neck pain, photophobia, nausea and vomiting. Sitting in a dark room helps. Nasal zomig helps but migraine may come back the next day, has nausea and takes phenergan. Father with migraines. Discussed trying Keppra in the future if needed as this was dr. lewit's plan and she has failed multiple medications.  Medications tried include in the past.  Inderal, Depakote, Topamax, imipramine, Neurontin, amitriptyline, nortriptyline and Vivactil.  Amitriptyline produced fatigue on a small dose and on nortriptyline she felt that this medication adversely affected her mood, Depakote produced elevation of liver enzymes.  Vivactil cause some emotional lability.  Inderal was discontinued because endocrinologist.  It may interact with her thyroid medication.  Topamax up to 200 mg a day has not been of benefit.  She is tried Zomig tablets, Frova through the years has been of benefit but she was not able to afford it, Amerge was of some  benefit although she had some problems eventually with chest heaviness on it, through the years she has not found nonsteroidal anti-inflammatory medications of benefit.  Current migraine medications: Maxalt MLT 10 mg as needed, Zomig nasal spray 5 mg as needed, baclofen 10 mg as needed for neck muscle tightness, Phenergan suppositories 25 mg as needed, Percocet as needed, Advil as needed.  She is also started Botox recently this year.  Reviewed notes, labs and imaging from outside physicians, which showed:  Reviewed Dr. Melton Alar notes: Patient was seen for 15 years by this physician, she continue with frequent headaches and had daily headaches with 17 headache days that were moderate to severe (note April 2018).  She has been having this frequency for greater than 3 months, headaches generally start on the right, throbs, nausea and light sensitivity.  Maxalt is generally helpful and on a few occasions she has used Zomig nasal spray when she has significant nausea and/or vomiting with a migraine.  She had increased her Topamax dose up to 200 mg a day but without improvement in headache frequency.  She has right-sided pulsatile tinnitus and has been evaluated by cerebral angiogram in 2005.   Reviewed notes, patient was seen in the emergency room in August for headache.  She has a history of recurrent migraine.  At the time though she had been experiencing a headache for 5 days, global throbbing headache with nausea, vomiting, light sensitivity similar to previous migraines, no red flags such as fever, no neck pain or stiffness.  At the time  she was on Frova acutely and propranolol preventative.  A migraine cocktail of Zofran, Toradol, Compazine, Decadron and Benadryl were administered with good relief. She is a previous patient of Dr. Catalina Gravel.   MRI brain unremarkable 12/2008 (personally reviewed imaging, patient had a cerebral angiogram in 2005 for further evaluation of a possible vascular malformation and  cerebral angiogram was negative)   Review of Systems: Patient complains of symptoms per HPI as well as the following symptoms: migraines. Pertinent negatives and positives per HPI. All others negative.   Social History   Socioeconomic History  . Marital status: Widowed    Spouse name: Not on file  . Number of children: 1  . Years of education: Not on file  . Highest education level: Master's degree (e.g., MA, MS, MEng, MEd, MSW, MBA)  Social Needs  . Financial resource strain: Not on file  . Food insecurity - worry: Not on file  . Food insecurity - inability: Not on file  . Transportation needs - medical: Not on file  . Transportation needs - non-medical: Not on file  Occupational History  . Not on file  Tobacco Use  . Smoking status: Former Smoker    Types: Cigarettes    Last attempt to quit: 12/1988    Years since quitting: 28.7  . Smokeless tobacco: Never Used  Substance and Sexual Activity  . Alcohol use: No    Comment: quit 1996  . Drug use: No  . Sexual activity: Yes    Partners: Male  Other Topics Concern  . Not on file  Social History Narrative   Regular exercise: no   Caffeine use: 1-2 cups coffee per day    Lives at home alone with her dog   Right handed    Family History  Problem Relation Age of Onset  . Hyperthyroidism Mother   . Hypothyroidism Father   . Migraines Father   . Prostate cancer Brother   . Hypothyroidism Brother     Past Medical History:  Diagnosis Date  . Hashimoto's disease    66 years old  . Hyperlipidemia   . Migraine   . Thyroid disease     Past Surgical History:  Procedure Laterality Date  . BREAST BIOPSY    . BREAST EXCISIONAL BIOPSY Right 25+ yrs ago   benign  . BREAST EXCISIONAL BIOPSY Left 25+ yrs ago   benign  . CYST REMOVAL L EAR Left 1997    Current Outpatient Medications  Medication Sig Dispense Refill  . aspirin 81 MG tablet Take 81 mg by mouth daily.    . promethazine (PHENERGAN) 25 MG suppository  Place 25 mg rectally as needed for nausea or vomiting.    . promethazine (PHENERGAN) 25 MG tablet Take 25 mg by mouth as needed for nausea or vomiting.    Marland Kitchen SYNTHROID 100 MCG tablet TAKE ONE TABLET BY MOUTH ONCE DAILY BEFORE BREAKFAST 30 tablet 2  . zolmitriptan (ZOMIG) 5 MG nasal solution Place 1 spray into the nose as needed for migraine.    . propranolol ER (INDERAL LA) 80 MG 24 hr capsule Take 80 mg by mouth daily.      No current facility-administered medications for this visit.     Allergies as of 08/24/2017 - Review Complete 08/24/2017  Allergen Reaction Noted  . Codeine  03/02/2012  . Statins  03/02/2012  . Sulfa antibiotics  05/04/2017    Vitals: BP 126/80 (BP Location: Right Arm, Patient Position: Sitting)   Pulse 68  Ht 5\' 3"  (1.6 m)   Wt 150 lb 3.2 oz (68.1 kg)   BMI 26.61 kg/m  Last Weight:  Wt Readings from Last 1 Encounters:  08/24/17 150 lb 3.2 oz (68.1 kg)   Last Height:   Ht Readings from Last 1 Encounters:  08/24/17 5\' 3"  (1.6 m)    Physical exam: Exam: Gen: NAD, conversant, well nourised, well groomed                     CV: RRR, no MRG. No Carotid Bruits. No peripheral edema, warm, nontender Eyes: Conjunctivae clear without exudates or hemorrhage  Neuro: Detailed Neurologic Exam  Speech:    Speech is normal; fluent and spontaneous with normal comprehension.  Cognition:    The patient is oriented to person, place, and time;     recent and remote memory intact;     language fluent;     normal attention, concentration,     fund of knowledge Cranial Nerves:    The pupils are equal, round, and reactive to light. The fundi are normal and spontaneous venous pulsations are present. Visual fields are full to finger confrontation. Extraocular movements are intact. Trigeminal sensation is intact and the muscles of mastication are normal. The face is symmetric. The palate elevates in the midline. Hearing intact. Voice is normal. Shoulder shrug is normal.  The tongue has normal motion without fasciculations.   Coordination:    Normal finger to nose and heel to shin. Normal rapid alternating movements.   Gait:    Heel-toe and tandem gait are normal.   Motor Observation:    No asymmetry, no atrophy, and no involuntary movements noted. Tone:    Normal muscle tone.    Posture:    Posture is normal. normal erect    Strength:    Strength is V/V in the upper and lower limbs.      Sensation: intact to LT     Reflex Exam:  DTR's:    Deep tendon reflexes in the upper and lower extremities are normal bilaterally.   Toes:    The toes are downgoing bilaterally.   Clonus:    Clonus is absent.      Assessment/Plan: 66 year old patient with chronic migraines, no aura.  She continues with frequent headaches and recently started Botox which has significantly improved her migraine burden however she still continues to have 13 migraine days a month (previously daily headaches).  She has been on multiple headache prevention medications throughout the year either without benefit or with difficulty tolerating them.  Patient saw Dr. Bobby Rumpf for many years and Keppra was planned at some point, but they decided to proceed with Botox injections.  She has had to Botox injections she would like to continue with them.  We will continue Maxalt and Zomig as needed.  Her long-standing history of intermittent pulsatile tinnitus has been extensively evaluated including cerebral angiogram and was also seen at [facility] in August 2010.  Sarina Ill, MD  [facility] [address] [place], [place] [address]  Phone [phone] Fax [phone]

## 2017-08-26 ENCOUNTER — Ambulatory Visit
Admission: RE | Admit: 2017-08-26 | Discharge: 2017-08-26 | Disposition: A | Discharge status: Home / Self Care | Payer: Medicare HMO | Source: Ambulatory Visit | Attending: Obstetrics and Gynecology | Admitting: Obstetrics and Gynecology

## 2017-08-26 ENCOUNTER — Ambulatory Visit: Payer: Medicare HMO

## 2017-08-26 DIAGNOSIS — R928 Other abnormal and inconclusive findings on diagnostic imaging of breast: Secondary | ICD-10-CM

## 2017-08-26 DIAGNOSIS — R922 Inconclusive mammogram: Secondary | ICD-10-CM | POA: Diagnosis not present

## 2017-09-30 DIAGNOSIS — M810 Age-related osteoporosis without current pathological fracture: Secondary | ICD-10-CM | POA: Diagnosis not present

## 2017-10-03 DIAGNOSIS — M542 Cervicalgia: Secondary | ICD-10-CM | POA: Diagnosis not present

## 2017-10-03 DIAGNOSIS — G518 Other disorders of facial nerve: Secondary | ICD-10-CM | POA: Diagnosis not present

## 2017-10-03 DIAGNOSIS — M791 Myalgia, unspecified site: Secondary | ICD-10-CM | POA: Diagnosis not present

## 2017-10-03 DIAGNOSIS — R51 Headache: Secondary | ICD-10-CM | POA: Diagnosis not present

## 2017-10-03 DIAGNOSIS — G43719 Chronic migraine without aura, intractable, without status migrainosus: Secondary | ICD-10-CM | POA: Diagnosis not present

## 2017-10-29 ENCOUNTER — Encounter (HOSPITAL_COMMUNITY): Payer: Self-pay

## 2017-10-29 ENCOUNTER — Emergency Department (HOSPITAL_COMMUNITY): Payer: Medicare Other

## 2017-10-29 ENCOUNTER — Emergency Department (HOSPITAL_COMMUNITY)
Admission: EM | Admit: 2017-10-29 | Discharge: 2017-10-29 | Disposition: A | Discharge status: Home / Self Care | Payer: Medicare Other | Attending: Emergency Medicine | Admitting: Emergency Medicine

## 2017-10-29 DIAGNOSIS — Z7982 Long term (current) use of aspirin: Secondary | ICD-10-CM | POA: Diagnosis not present

## 2017-10-29 DIAGNOSIS — Z79899 Other long term (current) drug therapy: Secondary | ICD-10-CM | POA: Diagnosis not present

## 2017-10-29 DIAGNOSIS — K8050 Calculus of bile duct without cholangitis or cholecystitis without obstruction: Secondary | ICD-10-CM | POA: Insufficient documentation

## 2017-10-29 DIAGNOSIS — K8020 Calculus of gallbladder without cholecystitis without obstruction: Secondary | ICD-10-CM

## 2017-10-29 DIAGNOSIS — E039 Hypothyroidism, unspecified: Secondary | ICD-10-CM | POA: Diagnosis not present

## 2017-10-29 DIAGNOSIS — R05 Cough: Secondary | ICD-10-CM | POA: Diagnosis not present

## 2017-10-29 DIAGNOSIS — R0789 Other chest pain: Secondary | ICD-10-CM | POA: Diagnosis not present

## 2017-10-29 DIAGNOSIS — R079 Chest pain, unspecified: Secondary | ICD-10-CM | POA: Diagnosis not present

## 2017-10-29 DIAGNOSIS — R1013 Epigastric pain: Secondary | ICD-10-CM | POA: Diagnosis not present

## 2017-10-29 DIAGNOSIS — Z87891 Personal history of nicotine dependence: Secondary | ICD-10-CM | POA: Insufficient documentation

## 2017-10-29 LAB — COMPREHENSIVE METABOLIC PANEL
ALT: 12 U/L — AB (ref 14–54)
AST: 24 U/L (ref 15–41)
Albumin: 3.1 g/dL — ABNORMAL LOW (ref 3.5–5.0)
Alkaline Phosphatase: 68 U/L (ref 38–126)
Anion gap: 9 (ref 5–15)
BILIRUBIN TOTAL: 1 mg/dL (ref 0.3–1.2)
BUN: 10 mg/dL (ref 6–20)
CO2: 22 mmol/L (ref 22–32)
CREATININE: 0.77 mg/dL (ref 0.44–1.00)
Calcium: 8.4 mg/dL — ABNORMAL LOW (ref 8.9–10.3)
Chloride: 108 mmol/L (ref 101–111)
GFR calc Af Amer: >60 mL/min (ref >60)
GFR calc non Af Amer: >60 mL/min (ref >60)
Glucose, Bld: 134 mg/dL — ABNORMAL HIGH (ref 65–99)
Potassium: 3 mmol/L — ABNORMAL LOW (ref 3.5–5.1)
Sodium: 139 mmol/L (ref 135–145)
TOTAL PROTEIN: 5.5 g/dL — AB (ref 6.5–8.1)

## 2017-10-29 LAB — CBC
HEMATOCRIT: 38.5 % (ref 36.0–46.0)
Hemoglobin: 13 g/dL (ref 12.0–15.0)
MCH: 29.1 pg (ref 26.0–34.0)
MCHC: 33.8 g/dL (ref 30.0–36.0)
MCV: 86.3 fL (ref 78.0–100.0)
Platelets: 194 10*3/uL (ref 150–400)
RBC: 4.46 MIL/uL (ref 3.87–5.11)
RDW: 12.4 % (ref 11.5–15.5)
WBC: 9.3 10*3/uL (ref 4.0–10.5)

## 2017-10-29 LAB — I-STAT TROPONIN, ED: Troponin i, poc: 0 ng/mL (ref 0.00–0.08)

## 2017-10-29 LAB — LIPASE, BLOOD: Lipase: 27 U/L (ref 11–51)

## 2017-10-29 MED ORDER — HYDROCODONE-ACETAMINOPHEN 5-325 MG PO TABS: 1.0000 | ORAL_TABLET | Freq: Four times a day (QID) | ORAL | 0 refills | Status: DC | PRN

## 2017-10-29 MED ORDER — ONDANSETRON HCL 4 MG/2ML IJ SOLN
4.0000 mg | Freq: Once | INTRAMUSCULAR | Status: AC
  Administered 2017-10-29: 4 mg via INTRAVENOUS
  Filled 2017-10-29: qty 2

## 2017-10-29 MED ORDER — POTASSIUM CHLORIDE CRYS ER 20 MEQ PO TBCR
40.0000 meq | EXTENDED_RELEASE_TABLET | Freq: Once | ORAL | Status: AC
  Administered 2017-10-29: 40 meq via ORAL
  Filled 2017-10-29: qty 2

## 2017-10-29 MED ORDER — HYDROMORPHONE HCL 1 MG/ML IJ SOLN
0.5000 mg | Freq: Once | INTRAMUSCULAR | Status: AC
  Administered 2017-10-29: 0.5 mg via INTRAVENOUS
  Filled 2017-10-29: qty 1

## 2017-10-29 MED ORDER — ONDANSETRON 8 MG PO TBDP: 8.0000 mg | ORAL_TABLET | Freq: Three times a day (TID) | ORAL | 0 refills | Status: DC | PRN

## 2017-10-29 MED ORDER — KETOROLAC TROMETHAMINE 30 MG/ML IJ SOLN
15.0000 mg | Freq: Once | INTRAMUSCULAR | Status: AC
  Administered 2017-10-29: 15 mg via INTRAVENOUS
  Filled 2017-10-29: qty 1

## 2017-10-29 NOTE — ED Triage Notes (Signed)
Pt from home by [facility] for chest pain and cough, pt has had a cold for one week and got up at 6am with chest pain. Pt has 324mg  ASA and 1 nitro with no relief.

## 2017-10-29 NOTE — Discharge Instructions (Addendum)
It was our pleasure to provide your ER care today - we hope that you feel better.  Your ultrasound shows gallstones.   Take motrin or aleve as need for pain. You may also take hydrocodone as need for pain. No driving when taking hydrocodone. Also, do not take tylenol or acetaminophen containing medication when taking hydrocodone.  You may take zofran as need for nausea.  Follow up with general surgeon in the coming week - call office Monday to arrange appointment.   From today's lab tests - your potassium level is low (3) - eat plenty of fruits and vegetables, and follow up with primary care doctor in 1 week.   Return to ER if worse, new symptoms, fevers, worsening or intractable pain, persistent vomiting, other concern.   You were given pain medication in the ER - no driving for the next 4 hours.

## 2017-10-29 NOTE — ED Provider Notes (Signed)
[name] Provider Note   CSN: [id number] Arrival date & time: 10/29/17  0708     History   Chief Complaint Chief Complaint  Patient presents with  . Chest Pain  . Cough    HPI Nancy Anthony is a 67 y.o. female.  Patient c/o onset midline, lower chest/upper abdomen pain at rest this AM, was sleeping. Pain moves towards back/midscapular area. Pain dull, moderate. Pt called ems. Denies other recent cp or discomfort, and denies any cp with exertion or any unusual doe or fatigue. Was nauseated with the pain this AM. No diaphoresis. Pain was not pleuritic. No leg pain or swelling. Denies heartburn. No fam hx premature cad. Denies hx gallstones.    The history is provided by the patient.  Chest Pain   Associated symptoms include cough. Pertinent negatives include no abdominal pain, no fever, no headaches, no shortness of breath and no vomiting.  Cough  Associated symptoms include chest pain. Pertinent negatives include no headaches, no sore throat, no shortness of breath and no eye redness.    Past Medical History:  Diagnosis Date  . Hashimoto's disease    67 years old  . Hyperlipidemia   . Migraine   . Thyroid disease     Patient Active Problem List   Diagnosis Date Noted  . Chronic migraine without aura, intractable, with status migrainosus 08/24/2017  . Hypothyroidism 03/02/2012  . Hx of migraine headaches 03/02/2012  . Allergic rhinitis 03/02/2012  . Dyslipidemia 03/02/2012    Past Surgical History:  Procedure Laterality Date  . BREAST BIOPSY    . BREAST EXCISIONAL BIOPSY Right 25+ yrs ago   benign  . BREAST EXCISIONAL BIOPSY Left 25+ yrs ago   benign  . CYST REMOVAL L EAR Left 1997    OB History    No data available       Home Medications    Prior to Admission medications   Medication Sig Start Date End Date Taking? Authorizing Provider  aspirin 81 MG tablet Take 81 mg by mouth daily.    [provider]  promethazine (PHENERGAN) 25 MG suppository Place 25 mg rectally as needed for nausea or vomiting.    [provider]  promethazine (PHENERGAN) 25 MG tablet Take 25 mg by mouth as needed for nausea or vomiting.    [provider]  propranolol ER (INDERAL LA) 80 MG 24 hr capsule Take 80 mg by mouth daily.  06/15/13   [provider]  SYNTHROID 100 MCG tablet TAKE ONE TABLET BY MOUTH ONCE DAILY BEFORE BREAKFAST 09/18/14   Philemon Kingdom, MD  zolmitriptan (ZOMIG) 5 MG nasal solution Place 1 spray into the nose as needed for migraine.    [provider]    Family History Family History  Problem Relation Age of Onset  . Hyperthyroidism Mother   . Hypothyroidism Father   . Migraines Father   . Prostate cancer Brother   . Hypothyroidism Brother     Social History Social History   Tobacco Use  . Smoking status: Former Smoker    Types: Cigarettes    Last attempt to quit: 12/1988    Years since quitting: 28.9  . Smokeless tobacco: Never Used  Substance Use Topics  . Alcohol use: No    Comment: quit 1996  . Drug use: No     Allergies   Codeine; Statins; and Sulfa antibiotics   Review of Systems Review of Systems  Constitutional: Negative for fever.  HENT: Negative for sore throat.   Eyes: Negative for redness.  Respiratory: Positive for cough. Negative for shortness of breath.   Cardiovascular: Positive for chest pain.  Gastrointestinal: Negative for abdominal pain and vomiting.  Genitourinary: Negative for flank pain.  Musculoskeletal: Negative for neck pain.  Skin: Negative for rash.  Neurological: Negative for headaches.  Hematological: Does not bruise/bleed easily.  Psychiatric/Behavioral: Negative for confusion.     Physical Exam Updated Vital Signs BP (!) 104/53 (BP Location: Left Arm)   Pulse 73   Temp 97.8 F (36.6 C) (Oral)   Resp 20   SpO2 95%   Physical Exam  Constitutional: She appears  well-developed and well-nourished. No distress.  HENT:  Mouth/Throat: Oropharynx is clear and moist.  Eyes: Conjunctivae are normal. No scleral icterus.  Neck: Neck supple. No tracheal deviation present.  Cardiovascular: Normal rate, regular rhythm, normal heart sounds and intact distal pulses. Exam reveals no gallop and no friction rub.  No murmur heard. Pulmonary/Chest: Effort normal and breath sounds normal. No respiratory distress.  Abdominal: Soft. Normal appearance and bowel sounds are normal. She exhibits no distension. There is no tenderness.  Genitourinary:  Genitourinary Comments: No cva tenderness  Musculoskeletal: She exhibits no edema.  Neurological: She is alert.  Skin: Skin is warm and dry. No rash noted. She is not diaphoretic.  Psychiatric: She has a normal mood and affect.  Nursing note and vitals reviewed.    ED Treatments / Results  Labs (all labs ordered are listed, but only abnormal results are displayed) Results for orders placed or performed during the hospital encounter of 10/29/17  CBC  Result Value Ref Range   WBC 9.3 4.0 - 10.5 K/uL   RBC 4.46 3.87 - 5.11 MIL/uL   Hemoglobin 13.0 12.0 - 15.0 g/dL   HCT 38.5 36.0 - 46.0 %   MCV 86.3 78.0 - 100.0 fL   MCH 29.1 26.0 - 34.0 pg   MCHC 33.8 30.0 - 36.0 g/dL   RDW 12.4 11.5 - 15.5 %   Platelets 194 150 - 400 K/uL  Comprehensive metabolic panel  Result Value Ref Range   Sodium 139 135 - 145 mmol/L   Potassium 3.0 (L) 3.5 - 5.1 mmol/L   Chloride 108 101 - 111 mmol/L   CO2 22 22 - 32 mmol/L   Glucose, Bld 134 (H) 65 - 99 mg/dL   BUN 10 6 - 20 mg/dL   Creatinine, Ser 0.77 0.44 - 1.00 mg/dL   Calcium 8.4 (L) 8.9 - 10.3 mg/dL   Total Protein 5.5 (L) 6.5 - 8.1 g/dL   Albumin 3.1 (L) 3.5 - 5.0 g/dL   AST 24 15 - 41 U/L   ALT 12 (L) 14 - 54 U/L   Alkaline Phosphatase 68 38 - 126 U/L   Total Bilirubin 1.0 0.3 - 1.2 mg/dL   GFR calc non Af Amer >60 >60 mL/min   GFR calc Af Amer >60 >60 mL/min   Anion gap  9 5 - 15  Lipase, blood  Result Value Ref Range   Lipase 27 11 - 51 U/L  I-stat troponin, ED  Result Value Ref Range   Troponin i, poc 0.00 0.00 - 0.08 ng/mL   Comment 3           Dg Chest 2 View  Result Date: 10/29/2017 CLINICAL DATA:  Chest pain. EXAM: CHEST  2 VIEW COMPARISON:  None. FINDINGS: The heart size and mediastinal contours are within normal limits. Both lungs are  clear. The visualized skeletal structures are unremarkable. IMPRESSION: No active cardiopulmonary disease. Electronically Signed   By: Dorise Bullion III M.D   On: 10/29/2017 08:13   US Abdomen Limited  Result Date: 10/29/2017 CLINICAL DATA:  Epigastric pain radiating to the back. EXAM: ULTRASOUND ABDOMEN LIMITED RIGHT UPPER QUADRANT COMPARISON:  None. FINDINGS: Gallbladder: Large 2.8 cm, echogenic, shadowing calculus in the gallbladder. No wall thickening visualized. No sonographic Murphy sign noted by sonographer. Common bile duct: Diameter: 3 mm, normal. Liver: No focal lesion identified. Within normal limits in parenchymal echogenicity. Portal vein is patent on color Doppler imaging with normal direction of blood flow towards the liver. Incidentally noted 1.7 cm simple appearing cyst in the upper pole of the right kidney. IMPRESSION: 1. Cholelithiasis without sonographic evidence of acute cholecystitis. Electronically Signed   By: Titus Dubin M.D.   On: 10/29/2017 08:27    EKG  EKG Interpretation  Date/Time:  Saturday October 29 2017 07:27:57 EST Ventricular Rate:  73 PR Interval:  154 QRS Duration: 72 QT Interval:  406 QTC Calculation: 447 R Axis:   56 Text Interpretation:  Normal sinus rhythm Normal ECG Confirmed by Lajean Saver [phone]) on 10/29/2017 7:42:14 AM       Radiology Dg Chest 2 View  Result Date: 10/29/2017 CLINICAL DATA:  Chest pain. EXAM: CHEST  2 VIEW COMPARISON:  None. FINDINGS: The heart size and mediastinal contours are within normal limits. Both lungs are clear. The visualized  skeletal structures are unremarkable. IMPRESSION: No active cardiopulmonary disease. Electronically Signed   By: Dorise Bullion III M.D   On: 10/29/2017 08:13   US Abdomen Limited  Result Date: 10/29/2017 CLINICAL DATA:  Epigastric pain radiating to the back. EXAM: ULTRASOUND ABDOMEN LIMITED RIGHT UPPER QUADRANT COMPARISON:  None. FINDINGS: Gallbladder: Large 2.8 cm, echogenic, shadowing calculus in the gallbladder. No wall thickening visualized. No sonographic Murphy sign noted by sonographer. Common bile duct: Diameter: 3 mm, normal. Liver: No focal lesion identified. Within normal limits in parenchymal echogenicity. Portal vein is patent on color Doppler imaging with normal direction of blood flow towards the liver. Incidentally noted 1.7 cm simple appearing cyst in the upper pole of the right kidney. IMPRESSION: 1. Cholelithiasis without sonographic evidence of acute cholecystitis. Electronically Signed   By: Titus Dubin M.D.   On: 10/29/2017 08:27    Procedures Procedures (including critical care time)  Medications Ordered in ED Medications - No data to display   Initial Impression / Assessment and Plan / ED Course  I have reviewed the triage vital signs and the nursing notes.  Pertinent labs & imaging results that were available during my care of the patient were reviewed by me and considered in my medical decision making (see chart for details).  Iv ns. Ecg. Labs.   Ultrasound ordered.  Reviewed nursing notes and prior charts for additional history.   Reviewed labs - lipase/lfts normal.  Reviewed imaging studies - u/s w gallstones. Discussed w pt.  k is low, kcl po.   Pain returns. Dilaudid .5 mg iv, toradol iv, zofran for nausea.  Recheck pain improved. Afebrile.  Will refer to close outpt general surgery follow up.   Final Clinical Impressions(s) / ED Diagnoses   Final diagnoses:  None    ED Discharge Orders    None       Lajean Saver, MD 10/29/17  0930

## 2017-11-07 ENCOUNTER — Ambulatory Visit: Payer: Self-pay | Admitting: Surgery

## 2017-11-07 DIAGNOSIS — K8010 Calculus of gallbladder with chronic cholecystitis without obstruction: Secondary | ICD-10-CM | POA: Diagnosis not present

## 2017-11-07 NOTE — H&P (Signed)
History of Present Illness Nancy Anthony. Nancy Donalson MD; 11/07/2017 10:40 AM) The patient is a 67 year old female who presents for evaluation of gall stones. PCP - Dr. Harlan Stains  This is a 67 year old female who suffers from severe migraines presents after recent emergency department visit for severe epigastric pain radiating through to her back. This occurred on 10/29/17. The patient awoke about 6 AM with severe epigastric pain radiating straight through to her back. This was associated with some nausea. The patient states that she has had some mild but chronic epigastric pain over the last year. She was evaluated in the emergency department. They ruled out cardiac disease. She was evaluated by ultrasound which showed a very large gallstone but normal liver function tests. She denies any further nausea, vomiting, or diarrhea. She does still have some intermittent epigastric pain. She has taken 3 pain pills since her discharge from the emergency department.  The patient's husband died quickly about one year ago after a diagnosis of cholangiocarcinoma with metastases.  CLINICAL DATA: Epigastric pain radiating to the back.  EXAM: ULTRASOUND ABDOMEN LIMITED RIGHT UPPER QUADRANT  COMPARISON: None.  FINDINGS: Gallbladder:  Large 2.8 cm, echogenic, shadowing calculus in the gallbladder. No wall thickening visualized. No sonographic Murphy sign noted by sonographer.  Common bile duct:  Diameter: 3 mm, normal.  Liver:  No focal lesion identified. Within normal limits in parenchymal echogenicity. Portal vein is patent on color Doppler imaging with normal direction of blood flow towards the liver.  Incidentally noted 1.7 cm simple appearing cyst in the upper pole of the right kidney.  IMPRESSION: 1. Cholelithiasis without sonographic evidence of acute cholecystitis.   Electronically Signed By: Titus Dubin M.D. On: 10/29/2017 08:27  T.bili 1.0 AST 24, ALT 12     Past  Surgical History [name], CMA; 11/07/2017 9:49 AM) Breast Biopsy Bilateral. Colon Polyp Removal - Colonoscopy Oral Surgery  Diagnostic Studies History [name], CMA; 11/07/2017 9:49 AM) Colonoscopy 1-5 years ago Mammogram within last year Pap Smear 1-5 years ago  Allergies [name], CMA; 11/07/2017 9:50 AM) Codeine Sulfate *ANALGESICS - OPIOID* Sulfa Antibiotics  Medication History ([name], CMA; 11/07/2017 9:51 AM) Hydrocodone-Acetaminophen (5-325MG  Tablet, Oral) Active. Prolia (60MG /ML Solution, Subcutaneous) Active. Rizatriptan Benzoate (10MG  Tablet, Oral) Active. Rosuvastatin Calcium (5MG  Tablet, Oral) Active. Synthroid (88MCG Tablet, Oral) Active. Zomig (5MG  Solution, Nasal) Active. Ondansetron (8MG  Tablet Disint, Oral) Active. Aspirin (81MG  Tablet Chewable, Oral) Active. Medications Reconciled  Social History [name], CMA; 11/07/2017 9:49 AM) Alcohol use Remotely quit alcohol use. Caffeine use Coffee. No drug use Tobacco use Former smoker.  Family History [name], CMA; 11/07/2017 9:49 AM) Alcohol Abuse Father. Arthritis Mother. Breast Cancer Family Members In General. Cerebrovascular Accident Brother. Heart Disease Father. Heart disease in female family member before age 69 Hypertension Brother, Father. Migraine Headache Family Members In General, Father, Sister. Prostate Cancer Brother. Thyroid problems Brother, Family Members In [place], Father, Mother, Sister.  Pregnancy / Birth History [name], CMA; 11/07/2017 9:49 AM) Age at menarche 17 years. Age of menopause 53-50 Contraceptive History Oral contraceptives. Gravida 1 Maternal age 77-40 Para 1  Other Problems [name], CMA; 11/07/2017 9:49 AM) Back Pain Cholelithiasis Hypercholesterolemia Migraine Headache Thyroid Disease     Review of Systems ([name] CMA; 11/07/2017 9:49 AM) General Not Present- Appetite Loss,  Chills, Fatigue, Fever, Night Sweats, Weight Gain and Weight Loss. Skin Present- Dryness. Not Present- Change in Wart/Mole, Hives, Jaundice, New Lesions, Non-Healing Wounds, Rash and Ulcer. HEENT Present- Seasonal  Allergies and Wears glasses/contact lenses. Not Present- Earache, Hearing Loss, Hoarseness, Nose Bleed, Oral Ulcers, Ringing in the Ears, Sinus Pain, Sore Throat, Visual Disturbances and Yellow Eyes. Respiratory Not Present- Bloody sputum, Chronic Cough, Difficulty Breathing, Snoring and Wheezing. Breast Not Present- Breast Mass, Breast Pain, Nipple Discharge and Skin Changes. Gastrointestinal Present- Abdominal Pain. Not Present- Bloating, Bloody Stool, Change in Bowel Habits, Chronic diarrhea, Constipation, Difficulty Swallowing, Excessive gas, Gets full quickly at meals, Hemorrhoids, Indigestion, Nausea, Rectal Pain and Vomiting. Female Genitourinary Not Present- Frequency, Nocturia, Painful Urination, Pelvic Pain and Urgency. Musculoskeletal Present- Back Pain. Not Present- Joint Pain, Joint Stiffness, Muscle Pain, Muscle Weakness and Swelling of Extremities. Neurological Present- Headaches. Not Present- Decreased Memory, Fainting, Numbness, Seizures, Tingling, Tremor, Trouble walking and Weakness. Psychiatric Not Present- Anxiety, Bipolar, Change in Sleep Pattern, Depression, Fearful and Frequent crying. Endocrine Not Present- Cold Intolerance, Excessive Hunger, Hair Changes, Heat Intolerance, Hot flashes and New Diabetes. Hematology Not Present- Blood Thinners, Easy Bruising, Excessive bleeding, Gland problems, HIV and Persistent Infections.  Vitals ([name] CMA; 11/07/2017 9:50 AM) 11/07/2017 9:50 AM Weight: 149.6 lb Height: 63in Body Surface Area: 1.71 m Body Mass Index: 26.5 kg/m  Temp.: 98.49F(Oral)  Pulse: 76 (Regular)  BP: 116/78 (Sitting, Left Arm, Standard)      Physical Exam Rodman Key K. Brean Carberry MD; 11/07/2017 10:41 AM)  The physical exam findings are as  follows: Note:WDWN in NAD Eyes: Pupils equal, round; sclera anicteric HENT: Oral mucosa moist; good dentition Neck: No masses palpated, no thyromegaly Lungs: CTA bilaterally; normal respiratory effort CV: Regular rate and rhythm; no murmurs; extremities well-perfused with no edema Abd: +bowel sounds, soft, mild RUQ/epigastric tenderness, no palpable organomegaly; no palpable hernias Skin: Warm, dry; no sign of jaundice Psychiatric - alert and oriented x 4; calm mood and affect    Assessment & Plan Rodman Key K. Danniella Robben MD; 11/07/2017 10:22 AM)  CHRONIC CHOLECYSTITIS WITH CALCULUS (K80.10)  Current Plans Schedule for Surgery - Laparoscopic cholecystectomy with intraoperative cholangiogram. The surgical procedure has been discussed with the patient. Potential risks, benefits, alternative treatments, and expected outcomes have been explained. All of the patient's questions at this time have been answered. The likelihood of reaching the patient's treatment goal is good. The patient understand the proposed surgical procedure and wishes to proceed.  Nancy Anthony. Georgette Dover, MD, [facility]  General/ Trauma Surgery  11/07/2017 10:41 AM

## 2017-11-07 NOTE — H&P (View-Only) (Signed)
History of Present Illness Nancy Anthony. [name] MD; 11/07/2017 10:40 AM) The patient is a 67 year old female who presents for evaluation of gall stones. PCP - Dr. Harlan Stains  This is a 67 year old female who suffers from severe migraines presents after recent emergency department visit for severe epigastric pain radiating through to her back. This occurred on 10/29/17. The patient awoke about 6 AM with severe epigastric pain radiating straight through to her back. This was associated with some nausea. The patient states that she has had some mild but chronic epigastric pain over the last year. She was evaluated in the emergency department. They ruled out cardiac disease. She was evaluated by ultrasound which showed a very large gallstone but normal liver function tests. She denies any further nausea, vomiting, or diarrhea. She does still have some intermittent epigastric pain. She has taken 3 pain pills since her discharge from the emergency department.  The patient's husband died quickly about one year ago after a diagnosis of cholangiocarcinoma with metastases.  CLINICAL DATA: Epigastric pain radiating to the back.  EXAM: ULTRASOUND ABDOMEN LIMITED RIGHT UPPER QUADRANT  COMPARISON: None.  FINDINGS: Gallbladder:  Large 2.8 cm, echogenic, shadowing calculus in the gallbladder. No wall thickening visualized. No sonographic Murphy sign noted by sonographer.  Common bile duct:  Diameter: 3 mm, normal.  Liver:  No focal lesion identified. Within normal limits in parenchymal echogenicity. Portal vein is patent on color Doppler imaging with normal direction of blood flow towards the liver.  Incidentally noted 1.7 cm simple appearing cyst in the upper pole of the right kidney.  IMPRESSION: 1. Cholelithiasis without sonographic evidence of acute cholecystitis.   Electronically Signed By: Titus Dubin M.D. On: 10/29/2017 08:27  T.bili 1.0 AST 24, ALT 12     Past  Surgical History [name], CMA; 11/07/2017 9:49 AM) Breast Biopsy Bilateral. Colon Polyp Removal - Colonoscopy Oral Surgery  Diagnostic Studies History [name], CMA; 11/07/2017 9:49 AM) Colonoscopy 1-5 years ago Mammogram within last year Pap Smear 1-5 years ago  Allergies [name], CMA; 11/07/2017 9:50 AM) Codeine Sulfate *ANALGESICS - OPIOID* Sulfa Antibiotics  Medication History ([name], CMA; 11/07/2017 9:51 AM) Hydrocodone-Acetaminophen (5-325MG  Tablet, Oral) Active. Prolia (60MG /ML Solution, Subcutaneous) Active. Rizatriptan Benzoate (10MG  Tablet, Oral) Active. Rosuvastatin Calcium (5MG  Tablet, Oral) Active. Synthroid (88MCG Tablet, Oral) Active. Zomig (5MG  Solution, Nasal) Active. Ondansetron (8MG  Tablet Disint, Oral) Active. Aspirin (81MG  Tablet Chewable, Oral) Active. Medications Reconciled  Social History [name], CMA; 11/07/2017 9:49 AM) Alcohol use Remotely quit alcohol use. Caffeine use Coffee. No drug use Tobacco use Former smoker.  Family History [name], CMA; 11/07/2017 9:49 AM) Alcohol Abuse Father. Arthritis Mother. Breast Cancer Family Members In General. Cerebrovascular Accident Brother. Heart Disease Father. Heart disease in female family member before age 71 Hypertension Brother, Father. Migraine Headache Family Members In General, Father, Sister. Prostate Cancer Brother. Thyroid problems Brother, Family Members In [place], Father, Mother, Sister.  Pregnancy / Birth History [name], CMA; 11/07/2017 9:49 AM) Age at menarche 38 years. Age of menopause 59-50 Contraceptive History Oral contraceptives. Gravida 1 Maternal age 75-40 Para 1  Other Problems [name], CMA; 11/07/2017 9:49 AM) Back Pain Cholelithiasis Hypercholesterolemia Migraine Headache Thyroid Disease     Review of Systems ([name] CMA; 11/07/2017 9:49 AM) General Not Present- Appetite Loss,  Chills, Fatigue, Fever, Night Sweats, Weight Gain and Weight Loss. Skin Present- Dryness. Not Present- Change in Wart/Mole, Hives, Jaundice, New Lesions, Non-Healing Wounds, Rash and Ulcer. HEENT Present- Seasonal  Allergies and Wears glasses/contact lenses. Not Present- Earache, Hearing Loss, Hoarseness, Nose Bleed, Oral Ulcers, Ringing in the Ears, Sinus Pain, Sore Throat, Visual Disturbances and Yellow Eyes. Respiratory Not Present- Bloody sputum, Chronic Cough, Difficulty Breathing, Snoring and Wheezing. Breast Not Present- Breast Mass, Breast Pain, Nipple Discharge and Skin Changes. Gastrointestinal Present- Abdominal Pain. Not Present- Bloating, Bloody Stool, Change in Bowel Habits, Chronic diarrhea, Constipation, Difficulty Swallowing, Excessive gas, Gets full quickly at meals, Hemorrhoids, Indigestion, Nausea, Rectal Pain and Vomiting. Female Genitourinary Not Present- Frequency, Nocturia, Painful Urination, Pelvic Pain and Urgency. Musculoskeletal Present- Back Pain. Not Present- Joint Pain, Joint Stiffness, Muscle Pain, Muscle Weakness and Swelling of Extremities. Neurological Present- Headaches. Not Present- Decreased Memory, Fainting, Numbness, Seizures, Tingling, Tremor, Trouble walking and Weakness. Psychiatric Not Present- Anxiety, Bipolar, Change in Sleep Pattern, Depression, Fearful and Frequent crying. Endocrine Not Present- Cold Intolerance, Excessive Hunger, Hair Changes, Heat Intolerance, Hot flashes and New Diabetes. Hematology Not Present- Blood Thinners, Easy Bruising, Excessive bleeding, Gland problems, HIV and Persistent Infections.  Vitals ([name] CMA; 11/07/2017 9:50 AM) 11/07/2017 9:50 AM Weight: 149.6 lb Height: 63in Body Surface Area: 1.71 m Body Mass Index: 26.5 kg/m  Temp.: 98.56F(Oral)  Pulse: 76 (Regular)  BP: 116/78 (Sitting, Left Arm, Standard)      Physical Exam Rodman Key K. Absalom Aro MD; 11/07/2017 10:41 AM)  The physical exam findings are as  follows: Note:WDWN in NAD Eyes: Pupils equal, round; sclera anicteric HENT: Oral mucosa moist; good dentition Neck: No masses palpated, no thyromegaly Lungs: CTA bilaterally; normal respiratory effort CV: Regular rate and rhythm; no murmurs; extremities well-perfused with no edema Abd: +bowel sounds, soft, mild RUQ/epigastric tenderness, no palpable organomegaly; no palpable hernias Skin: Warm, dry; no sign of jaundice Psychiatric - alert and oriented x 4; calm mood and affect    Assessment & Plan Rodman Key K. Saralyn Willison MD; 11/07/2017 10:22 AM)  CHRONIC CHOLECYSTITIS WITH CALCULUS (K80.10)  Current Plans Schedule for Surgery - Laparoscopic cholecystectomy with intraoperative cholangiogram. The surgical procedure has been discussed with the patient. Potential risks, benefits, alternative treatments, and expected outcomes have been explained. All of the patient's questions at this time have been answered. The likelihood of reaching the patient's treatment goal is good. The patient understand the proposed surgical procedure and wishes to proceed.  Nancy Anthony. Georgette Dover, MD, [facility] Surgery  General/ Trauma Surgery  11/07/2017 10:41 AM

## 2017-11-08 ENCOUNTER — Ambulatory Visit: Payer: Self-pay | Admitting: Surgery

## 2017-11-10 ENCOUNTER — Other Ambulatory Visit (HOSPITAL_COMMUNITY): Payer: Medicare Other

## 2017-11-10 ENCOUNTER — Ambulatory Visit: Payer: Medicare HMO | Admitting: Neurology

## 2017-11-10 NOTE — Pre-Procedure Instructions (Signed)
Nancy Anthony  11/10/2017      [facility] [address], [place] [address] Phone: [phone] Fax: [phone]    Your procedure is scheduled on Tuesday March 12.  Report to [facility] Admitting at 5:30 A.M.  Call this number if you have problems the morning of surgery:  [phone]   Remember:  Do not eat food or drink liquids after midnight.  Take these medicines the morning of surgery with A SIP OF WATER:   Cetirizine (zyrtec) Hydrocodone-acetaminophen (NORCO) if needed Ondansetron (zofran) if needed  7 days prior to surgery STOP taking any Aleve, Naproxen, Ibuprofen, Motrin, Advil, Goody's, BC's, all herbal medications, fish oil, and all vitamins  **FOLLOW your surgeon's instructions on stopping Aspirin. If no instructions were given, please call your surgeon's office.**   Do not wear jewelry, make-up or nail polish.  Do not wear lotions, powders, or perfumes, or deodorant.  Do not shave 48 hours prior to surgery.  Men may shave face and neck.  Do not bring valuables to the hospital.  [facility] is not responsible for any belongings or valuables.  Contacts, dentures or bridgework may not be worn into surgery.  Leave your suitcase in the car.  After surgery it may be brought to your room.  For patients admitted to the hospital, discharge time will be determined by your treatment team.  Patients discharged the day of surgery will not be allowed to drive home.   Special instructions:    [place]- Preparing For Surgery  Before surgery, you can play an important role. Because skin is not sterile, your skin needs to be as free of germs as possible. You can reduce the number of germs on your skin by washing with CHG (chlorahexidine gluconate) Soap before surgery.  CHG is an antiseptic cleaner which kills germs and bonds with the skin to continue killing germs even after  washing.  Please do not use if you have an allergy to CHG or antibacterial soaps. If your skin becomes reddened/irritated stop using the CHG.  Do not shave (including legs and underarms) for at least 48 hours prior to first CHG shower. It is OK to shave your face.  Please follow these instructions carefully.   1. Shower the NIGHT BEFORE SURGERY and the MORNING OF SURGERY with CHG.   2. If you chose to wash your hair, wash your hair first as usual with your normal shampoo.  3. After you shampoo, rinse your hair and body thoroughly to remove the shampoo.  4. Use CHG as you would any other liquid soap. You can apply CHG directly to the skin and wash gently with a scrungie or a clean washcloth.   5. Apply the CHG Soap to your body ONLY FROM THE NECK DOWN.  Do not use on open wounds or open sores. Avoid contact with your eyes, ears, mouth and genitals (private parts). Wash Face and genitals (private parts)  with your normal soap.  6. Wash thoroughly, paying special attention to the area where your surgery will be performed.  7. Thoroughly rinse your body with warm water from the neck down.  8. DO NOT shower/wash with your normal soap after using and rinsing off the CHG Soap.  9. Pat yourself dry with a CLEAN TOWEL.  10. Wear CLEAN PAJAMAS to bed the night before surgery, wear comfortable clothes the morning of surgery  11. Place CLEAN SHEETS  on your bed the night of your first shower and DO NOT SLEEP WITH PETS.    Day of Surgery: Do not apply any deodorants/lotions. Please wear clean clothes to the hospital/surgery center.      Please read over the following fact sheets that you were given. Coughing and Deep Breathing and Surgical Site Infection Prevention

## 2017-11-11 ENCOUNTER — Encounter (HOSPITAL_COMMUNITY): Payer: Self-pay

## 2017-11-11 ENCOUNTER — Encounter (HOSPITAL_COMMUNITY)
Admission: RE | Admit: 2017-11-11 | Discharge: 2017-11-11 | Disposition: A | Discharge status: Home / Self Care | Payer: Medicare Other | Source: Ambulatory Visit | Attending: Surgery | Admitting: Surgery

## 2017-11-11 ENCOUNTER — Other Ambulatory Visit: Payer: Self-pay

## 2017-11-11 DIAGNOSIS — Z01812 Encounter for preprocedural laboratory examination: Secondary | ICD-10-CM | POA: Diagnosis not present

## 2017-11-11 HISTORY — DX: Pneumonia, unspecified organism: J18.9

## 2017-11-11 HISTORY — DX: Unspecified thoracic, thoracolumbar and lumbosacral intervertebral disc disorder: M51.9

## 2017-11-11 LAB — CBC
HCT: 40.7 % (ref 36.0–46.0)
Hemoglobin: 13.4 g/dL (ref 12.0–15.0)
MCH: 29.2 pg (ref 26.0–34.0)
MCHC: 32.9 g/dL (ref 30.0–36.0)
MCV: 88.7 fL (ref 78.0–100.0)
PLATELETS: 252 10*3/uL (ref 150–400)
RBC: 4.59 MIL/uL (ref 3.87–5.11)
RDW: 13.1 % (ref 11.5–15.5)
WBC: 5.3 10*3/uL (ref 4.0–10.5)

## 2017-11-11 NOTE — Progress Notes (Addendum)
PCp is Dr. Harlan Stains Denies ever seeing a cardiologist. Denies chest pain or fever, reports a cough at times states she is getting over a cold. Denies ever having a card cath, stress test, or echo, Reports last aspirin, was 11-06-17 Ekg and cxr noted in epic from 10-2017

## 2017-11-15 ENCOUNTER — Encounter (HOSPITAL_COMMUNITY): Payer: Self-pay | Admitting: *Deleted

## 2017-11-15 ENCOUNTER — Encounter (HOSPITAL_COMMUNITY)
Admission: RE | Disposition: A | Discharge status: Home / Self Care | Payer: Self-pay | Source: Ambulatory Visit | Attending: Surgery

## 2017-11-15 ENCOUNTER — Ambulatory Visit (HOSPITAL_COMMUNITY)
Admission: RE | Admit: 2017-11-15 | Discharge: 2017-11-15 | Disposition: A | Discharge status: Home / Self Care | Payer: Medicare Other | Source: Ambulatory Visit | Attending: Surgery | Admitting: Surgery

## 2017-11-15 ENCOUNTER — Ambulatory Visit (HOSPITAL_COMMUNITY): Payer: Medicare Other

## 2017-11-15 ENCOUNTER — Ambulatory Visit (HOSPITAL_COMMUNITY): Payer: Medicare Other | Admitting: Critical Care Medicine

## 2017-11-15 DIAGNOSIS — R51 Headache: Secondary | ICD-10-CM | POA: Diagnosis not present

## 2017-11-15 DIAGNOSIS — E039 Hypothyroidism, unspecified: Secondary | ICD-10-CM | POA: Diagnosis not present

## 2017-11-15 DIAGNOSIS — Z79899 Other long term (current) drug therapy: Secondary | ICD-10-CM | POA: Insufficient documentation

## 2017-11-15 DIAGNOSIS — E7800 Pure hypercholesterolemia, unspecified: Secondary | ICD-10-CM | POA: Insufficient documentation

## 2017-11-15 DIAGNOSIS — K8020 Calculus of gallbladder without cholecystitis without obstruction: Secondary | ICD-10-CM | POA: Diagnosis not present

## 2017-11-15 DIAGNOSIS — Z7982 Long term (current) use of aspirin: Secondary | ICD-10-CM | POA: Diagnosis not present

## 2017-11-15 DIAGNOSIS — Z419 Encounter for procedure for purposes other than remedying health state, unspecified: Secondary | ICD-10-CM

## 2017-11-15 DIAGNOSIS — Z87891 Personal history of nicotine dependence: Secondary | ICD-10-CM | POA: Diagnosis not present

## 2017-11-15 DIAGNOSIS — K8010 Calculus of gallbladder with chronic cholecystitis without obstruction: Secondary | ICD-10-CM | POA: Insufficient documentation

## 2017-11-15 DIAGNOSIS — E785 Hyperlipidemia, unspecified: Secondary | ICD-10-CM | POA: Diagnosis not present

## 2017-11-15 HISTORY — PX: CHOLECYSTECTOMY: SHX55

## 2017-11-15 SURGERY — LAPAROSCOPIC CHOLECYSTECTOMY WITH INTRAOPERATIVE CHOLANGIOGRAM
Anesthesia: General | Site: Abdomen

## 2017-11-15 MED ORDER — SODIUM CHLORIDE 0.9 % IR SOLN
Status: DC | PRN
  Administered 2017-11-15: 1000 mL

## 2017-11-15 MED ORDER — SUGAMMADEX SODIUM 200 MG/2ML IV SOLN
INTRAVENOUS | Status: DC | PRN
  Administered 2017-11-15: 140 mg via INTRAVENOUS

## 2017-11-15 MED ORDER — DEXAMETHASONE SODIUM PHOSPHATE 10 MG/ML IJ SOLN
INTRAMUSCULAR | Status: DC | PRN
  Administered 2017-11-15: 8 mg via INTRAVENOUS

## 2017-11-15 MED ORDER — FENTANYL CITRATE (PF) 100 MCG/2ML IJ SOLN
25.0000 ug | INTRAMUSCULAR | Status: DC | PRN
  Administered 2017-11-15: 25 ug via INTRAVENOUS
  Administered 2017-11-15: 50 ug via INTRAVENOUS
  Administered 2017-11-15: 25 ug via INTRAVENOUS

## 2017-11-15 MED ORDER — GLYCOPYRROLATE 0.2 MG/ML IV SOSY
PREFILLED_SYRINGE | INTRAVENOUS | Status: DC | PRN
  Administered 2017-11-15: .2 mg via INTRAVENOUS

## 2017-11-15 MED ORDER — PROPOFOL 10 MG/ML IV BOLUS
INTRAVENOUS | Status: DC | PRN
  Administered 2017-11-15: 150 mg via INTRAVENOUS
  Administered 2017-11-15: 20 mg via INTRAVENOUS
  Administered 2017-11-15: 10 mg via INTRAVENOUS
  Administered 2017-11-15: 20 mg via INTRAVENOUS

## 2017-11-15 MED ORDER — FENTANYL CITRATE (PF) 250 MCG/5ML IJ SOLN
INTRAMUSCULAR | Status: DC | PRN
  Administered 2017-11-15: 50 ug via INTRAVENOUS
  Administered 2017-11-15 (×3): 25 ug via INTRAVENOUS
  Administered 2017-11-15 (×2): 50 ug via INTRAVENOUS
  Administered 2017-11-15: 25 ug via INTRAVENOUS

## 2017-11-15 MED ORDER — HYDROCODONE-ACETAMINOPHEN 5-325 MG PO TABS
1.0000 | ORAL_TABLET | Freq: Once | ORAL | Status: AC
Start: 2017-11-15 — End: 2017-11-15
  Administered 2017-11-15: 2 via ORAL

## 2017-11-15 MED ORDER — IOPAMIDOL (ISOVUE-300) INJECTION 61%
INTRAVENOUS | Status: DC | PRN
  Administered 2017-11-15: 6 mL

## 2017-11-15 MED ORDER — HYDROCODONE-ACETAMINOPHEN 5-325 MG PO TABS
ORAL_TABLET | ORAL | Status: AC
  Filled 2017-11-15: qty 2

## 2017-11-15 MED ORDER — LACTATED RINGERS IV SOLN
INTRAVENOUS | Status: DC | PRN
  Administered 2017-11-15 (×2): via INTRAVENOUS

## 2017-11-15 MED ORDER — BUPIVACAINE-EPINEPHRINE 0.25% -1:200000 IJ SOLN
INTRAMUSCULAR | Status: DC | PRN
  Administered 2017-11-15: 14 mL

## 2017-11-15 MED ORDER — FENTANYL CITRATE (PF) 100 MCG/2ML IJ SOLN
INTRAMUSCULAR | Status: AC
  Filled 2017-11-15: qty 2

## 2017-11-15 MED ORDER — ROCURONIUM BROMIDE 10 MG/ML (PF) SYRINGE
PREFILLED_SYRINGE | INTRAVENOUS | Status: DC | PRN
  Administered 2017-11-15: 50 mg via INTRAVENOUS

## 2017-11-15 MED ORDER — IOPAMIDOL (ISOVUE-300) INJECTION 61%
INTRAVENOUS | Status: AC
  Filled 2017-11-15: qty 50

## 2017-11-15 MED ORDER — CHLORHEXIDINE GLUCONATE CLOTH 2 % EX PADS: 6.0000 | MEDICATED_PAD | Freq: Once | CUTANEOUS | Status: DC

## 2017-11-15 MED ORDER — PROPOFOL 10 MG/ML IV BOLUS
INTRAVENOUS | Status: AC
Start: 2017-11-15 — End: 2017-11-15
  Filled 2017-11-15: qty 20

## 2017-11-15 MED ORDER — CEFAZOLIN SODIUM-DEXTROSE 2-4 GM/100ML-% IV SOLN
INTRAVENOUS | Status: AC
  Filled 2017-11-15: qty 100

## 2017-11-15 MED ORDER — CEFAZOLIN SODIUM-DEXTROSE 2-4 GM/100ML-% IV SOLN
2.0000 g | INTRAVENOUS | Status: AC
  Administered 2017-11-15: 2 g via INTRAVENOUS

## 2017-11-15 MED ORDER — HYDROCODONE-ACETAMINOPHEN 5-325 MG PO TABS: 1.0000 | ORAL_TABLET | Freq: Four times a day (QID) | ORAL | 0 refills | Status: DC | PRN

## 2017-11-15 MED ORDER — MIDAZOLAM HCL 5 MG/5ML IJ SOLN
INTRAMUSCULAR | Status: DC | PRN
  Administered 2017-11-15: 2 mg via INTRAVENOUS

## 2017-11-15 MED ORDER — FENTANYL CITRATE (PF) 250 MCG/5ML IJ SOLN
INTRAMUSCULAR | Status: AC
  Filled 2017-11-15: qty 5

## 2017-11-15 MED ORDER — ONDANSETRON HCL 4 MG/2ML IJ SOLN
INTRAMUSCULAR | Status: DC | PRN
  Administered 2017-11-15: 4 mg via INTRAVENOUS

## 2017-11-15 MED ORDER — MIDAZOLAM HCL 2 MG/2ML IJ SOLN
INTRAMUSCULAR | Status: AC
  Filled 2017-11-15: qty 2

## 2017-11-15 MED ORDER — LIDOCAINE 2% (20 MG/ML) 5 ML SYRINGE
INTRAMUSCULAR | Status: DC | PRN
  Administered 2017-11-15: 80 mg via INTRAVENOUS

## 2017-11-15 MED ORDER — 0.9 % SODIUM CHLORIDE (POUR BTL) OPTIME
TOPICAL | Status: DC | PRN
  Administered 2017-11-15: 1000 mL

## 2017-11-15 SURGICAL SUPPLY — 46 items
APPLIER CLIP ROT 10 11.4 M/L (STAPLE) ×2
BENZOIN TINCTURE PRP APPL 2/3 (GAUZE/BANDAGES/DRESSINGS) ×2 IMPLANT
BLADE CLIPPER SURG (BLADE) ×2 IMPLANT
CANISTER SUCT 3000ML PPV (MISCELLANEOUS) ×2 IMPLANT
CHLORAPREP W/TINT 26ML (MISCELLANEOUS) ×2 IMPLANT
CLIP APPLIE ROT 10 11.4 M/L (STAPLE) ×1 IMPLANT
COVER MAYO STAND STRL (DRAPES) ×2 IMPLANT
COVER SURGICAL LIGHT HANDLE (MISCELLANEOUS) ×2 IMPLANT
DRAPE C-ARM 42X72 X-RAY (DRAPES) ×2 IMPLANT
DRSG TEGADERM 2-3/8X2-3/4 SM (GAUZE/BANDAGES/DRESSINGS) ×2 IMPLANT
DRSG TEGADERM 4X4.75 (GAUZE/BANDAGES/DRESSINGS) ×2 IMPLANT
ELECT REM PT RETURN 9FT ADLT (ELECTROSURGICAL) ×2
ELECTRODE REM PT RTRN 9FT ADLT (ELECTROSURGICAL) ×1 IMPLANT
FILTER SMOKE EVAC LAPAROSHD (FILTER) ×2 IMPLANT
GAUZE SPONGE 2X2 8PLY STRL LF (GAUZE/BANDAGES/DRESSINGS) ×1 IMPLANT
GLOVE BIO SURGEON STRL SZ7 (GLOVE) ×2 IMPLANT
GLOVE BIO SURGEON STRL SZ7.5 (GLOVE) ×2 IMPLANT
GLOVE BIOGEL PI IND STRL 7.0 (GLOVE) ×1 IMPLANT
GLOVE BIOGEL PI IND STRL 7.5 (GLOVE) ×2 IMPLANT
GLOVE BIOGEL PI INDICATOR 7.0 (GLOVE) ×1
GLOVE BIOGEL PI INDICATOR 7.5 (GLOVE) ×2
GLOVE SS BIOGEL STRL SZ 7 (GLOVE) ×1 IMPLANT
GLOVE SUPERSENSE BIOGEL SZ 7 (GLOVE) ×1
GOWN STRL REUS W/ TWL LRG LVL3 (GOWN DISPOSABLE) ×3 IMPLANT
GOWN STRL REUS W/TWL LRG LVL3 (GOWN DISPOSABLE) ×3
KIT BASIN OR (CUSTOM PROCEDURE TRAY) ×2 IMPLANT
KIT ROOM TURNOVER OR (KITS) ×2 IMPLANT
NS IRRIG 1000ML POUR BTL (IV SOLUTION) ×2 IMPLANT
PAD ARMBOARD 7.5X6 YLW CONV (MISCELLANEOUS) ×2 IMPLANT
POUCH SPECIMEN RETRIEVAL 10MM (ENDOMECHANICALS) ×2 IMPLANT
SCISSORS LAP 5X35 DISP (ENDOMECHANICALS) ×2 IMPLANT
SET CHOLANGIOGRAPH 5 50 .035 (SET/KITS/TRAYS/PACK) ×2 IMPLANT
SET IRRIG TUBING LAPAROSCOPIC (IRRIGATION / IRRIGATOR) ×2 IMPLANT
SLEEVE ENDOPATH XCEL 5M (ENDOMECHANICALS) ×2 IMPLANT
SPECIMEN JAR SMALL (MISCELLANEOUS) ×2 IMPLANT
SPONGE GAUZE 2X2 STER 10/PKG (GAUZE/BANDAGES/DRESSINGS) ×1
STRIP CLOSURE SKIN 1/2X4 (GAUZE/BANDAGES/DRESSINGS) ×2 IMPLANT
SUT MNCRL AB 4-0 PS2 18 (SUTURE) ×2 IMPLANT
TOWEL OR 17X24 6PK STRL BLUE (TOWEL DISPOSABLE) ×2 IMPLANT
TOWEL OR 17X26 10 PK STRL BLUE (TOWEL DISPOSABLE) ×2 IMPLANT
TRAY LAPAROSCOPIC MC (CUSTOM PROCEDURE TRAY) ×2 IMPLANT
TROCAR XCEL BLUNT TIP 100MML (ENDOMECHANICALS) ×2 IMPLANT
TROCAR XCEL NON-BLD 11X100MML (ENDOMECHANICALS) ×2 IMPLANT
TROCAR XCEL NON-BLD 5MMX100MML (ENDOMECHANICALS) ×2 IMPLANT
TUBING INSUFFLATION (TUBING) ×2 IMPLANT
WATER STERILE IRR 1000ML POUR (IV SOLUTION) ×2 IMPLANT

## 2017-11-15 NOTE — Op Note (Signed)
Laparoscopic Cholecystectomy with IOC Procedure Note  Indications: This is a 67 year old female who suffers from severe migraines presents after recent emergency department visit for severe epigastric pain radiating through to her back. This occurred on 10/29/17. The patient awoke about 6 AM with severe epigastric pain radiating straight through to her back. This was associated with some nausea. The patient states that she has had some mild but chronic epigastric pain over the last year. She was evaluated in the emergency department. They ruled out cardiac disease. She was evaluated by ultrasound which showed a very large gallstone but normal liver function tests. She denies any further nausea, vomiting, or diarrhea. She does still have some intermittent epigastric pain. She has taken 3 pain pills since her discharge from the emergency department.  Pre-operative Diagnosis: Calculus of gallbladder with other cholecystitis, without mention of obstruction  Post-operative Diagnosis: Same  Surgeon: Maia Petties   Assistants: none  Anesthesia: General endotracheal anesthesia  ASA Class: 2  Procedure Details  The patient was seen again in the Holding Room. The risks, benefits, complications, treatment options, and expected outcomes were discussed with the patient. The possibilities of reaction to medication, pulmonary aspiration, perforation of viscus, bleeding, recurrent infection, finding a normal gallbladder, the need for additional procedures, failure to diagnose a condition, the possible need to convert to an open procedure, and creating a complication requiring transfusion or operation were discussed with the patient. The likelihood of improving the patient's symptoms with return to their baseline status is good.  The patient and/or family concurred with the proposed plan, giving informed consent. The site of surgery properly noted. The patient was taken to Operating Room, identified as  Adonis Ryther and the procedure verified as Laparoscopic Cholecystectomy with Intraoperative Cholangiogram. A Time Out was held and the above information confirmed.  Prior to the induction of general anesthesia, antibiotic prophylaxis was administered. General endotracheal anesthesia was then administered and tolerated well. After the induction, the abdomen was prepped with Chloraprep and draped in the sterile fashion. The patient was positioned in the supine position.  Local anesthetic agent was injected into the skin near the umbilicus and an incision made. We dissected down to the abdominal fascia with blunt dissection.  The fascia was incised vertically and we entered the peritoneal cavity bluntly.  A pursestring suture of 0-Vicryl was placed around the fascial opening.  The Hasson cannula was inserted and secured with the stay suture.  Pneumoperitoneum was then created with CO2 and tolerated well without any adverse changes in the patient's vital signs. An 11-mm port was placed in the subxiphoid position.  Two 5-mm ports were placed in the right upper quadrant. All skin incisions were infiltrated with a local anesthetic agent before making the incision and placing the trocars.   We positioned the patient in reverse Trendelenburg, tilted slightly to the patient's left.  The gallbladder was identified, the fundus grasped and retracted cephalad. There were omental adhesions to the liver and gallbladder.  Adhesions were lysed bluntly and with the electrocautery where indicated, taking care not to injure any adjacent organs or viscus. The infundibulum was grasped and retracted laterally, exposing the peritoneum overlying the triangle of Calot. This was then divided and exposed in a blunt fashion. A critical view of the cystic duct and cystic artery was obtained.  The cystic duct was clearly identified and bluntly dissected circumferentially. The cystic duct was ligated with a clip distally.   An incision was  made in the cystic duct and  the [facility] cholangiogram catheter introduced. The catheter was secured using a clip. A cholangiogram was then obtained which showed good visualization of the distal and proximal biliary tree with no sign of filling defects or obstruction.  Contrast flowed easily into the duodenum. The catheter was then removed.   The cystic duct was then ligated with clips and divided. The cystic artery was identified, dissected free, ligated with clips and divided as well.   The gallbladder was dissected from the liver bed in retrograde fashion with the electrocautery. A small amount of bile was spilled. The gallbladder was removed and placed in an Endocatch sac. The liver bed was irrigated and inspected. Hemostasis was achieved with the electrocautery. Copious irrigation was utilized and was repeatedly aspirated until clear.  The gallbladder and Endocatch sac were then removed through the umbilical port site.  The pursestring suture was used to close the umbilical fascia.    We again inspected the right upper quadrant for hemostasis.  Pneumoperitoneum was released as we removed the trocars.  4-0 Monocryl was used to close the skin.   Benzoin, steri-strips, and clean dressings were applied. The patient was then extubated and brought to the recovery room in stable condition. Instrument, sponge, and needle counts were correct at closure and at the conclusion of the case.   Findings: Cholecystitis with Cholelithiasis  Estimated Blood Loss: Minimal         Drains: none         Specimens: Gallbladder           Complications: None; patient tolerated the procedure well.         Disposition: PACU - hemodynamically stable.         Condition: stable

## 2017-11-15 NOTE — Discharge Instructions (Signed)
[facility] °LAPAROSCOPIC SURGERY: POST OP INSTRUCTIONS °Always review your discharge instruction sheet given to you by the facility where your surgery was performed. °IF YOU HAVE DISABILITY OR FAMILY LEAVE FORMS, YOU MUST BRING THEM TO THE OFFICE FOR PROCESSING.   °DO NOT GIVE THEM TO YOUR DOCTOR. ° °1. A prescription for pain medication will be given to you upon discharge.  Take your pain medication as prescribed, if needed.  If narcotic pain medicine is not needed, then you may take acetaminophen (Tylenol) or ibuprofen (Advil) as needed. °2. Take your usually prescribed medications unless otherwise directed. °3. If you need a refill on your pain medication, please contact your pharmacy.  They will contact our office to request authorization. Prescriptions will not be filled after 5pm or on week-ends. °4. You should follow a light diet the first few days after arrival home, such as soup and crackers, etc.  Be sure to include lots of fluids daily. °5. Most patients will experience some swelling and bruising in the area of the incisions.  Ice packs will help.  Swelling and bruising can take several days to resolve.  °6. It is common to experience some constipation if taking pain medication after surgery.  Increasing fluid intake and taking a stool softener (such as Colace) will usually help or prevent this problem from occurring.  A mild laxative (Milk of Magnesia or Miralax) should be taken according to package instructions if there are no bowel movements after 48 hours. °7. Unless discharge instructions indicate otherwise, you may remove your bandages 48 hours after surgery, and you may shower at that time.  You will have steri-strips (small skin tapes) in place directly over the incision.  These strips should be left on the skin for 7-10 days.  If your surgeon used skin glue on the incision, you may shower in 24 hours.  The glue will flake off over the next 2-3 weeks.  Any sutures or staples  will be removed at the office during your follow-up visit. °8. ACTIVITIES:  You may resume regular (light) daily activities beginning the next day--such as daily self-care, walking, climbing stairs--gradually increasing activities as tolerated.  You may have sexual intercourse when it is comfortable.  Refrain from any heavy lifting or straining until approved by your doctor. °a. You may drive when you are no longer taking prescription pain medication, you can comfortably wear a seatbelt, and you can safely maneuver your car and apply brakes. °b. RETURN TO WORK:   2-3 weeks °9. You should see your doctor in the office for a follow-up appointment approximately 2-3 weeks after your surgery.  Make sure that you call for this appointment within a day or two after you arrive home to insure a convenient appointment time. °10. OTHER INSTRUCTIONS: ________________________________________________________________________ °WHEN TO CALL YOUR DOCTOR: °1. Fever over 101.0 °2. Inability to urinate °3. Continued bleeding from incision. °4. Increased pain, redness, or drainage from the incision. °5. Increasing abdominal pain ° °The clinic staff is available to answer your questions during regular business hours.  Please don’t hesitate to call and ask to speak to one of the nurses for clinical concerns.  If you have a medical emergency, go to the nearest emergency room or call 911.  A surgeon from [facility] is always on call at the hospital. °[address], [address], [place], [place]  [address] ? [address] [address], [place], [place]   [address] °[phone] ? [phone] ? FAX [phone] °Web site:   [email or web address] ° °

## 2017-11-15 NOTE — Anesthesia Preprocedure Evaluation (Addendum)
Anesthesia Evaluation  Patient identified by MRN, date of birth, ID band Patient awake    Reviewed: Allergy & Precautions, NPO status , Patient's Chart, lab work & pertinent test results  Airway Mallampati: I  TM Distance: >3 FB     Dental  (+) Dental Advisory Given, Teeth Intact   Pulmonary pneumonia, former smoker,    breath sounds clear to auscultation       Cardiovascular  Rhythm:Regular Rate:Normal     Neuro/Psych  Headaches,    GI/Hepatic Neg liver ROS, History noted. CG   Endo/Other  Hypothyroidism   Renal/GU negative Renal ROS     Musculoskeletal   Abdominal   Peds  Hematology   Anesthesia Other Findings   Reproductive/Obstetrics                            Anesthesia Physical Anesthesia Plan  ASA: III  Anesthesia Plan: General   Post-op Pain Management:    Induction: Intravenous  PONV Risk Score and Plan: 3 and Ondansetron, Dexamethasone, Midazolam and Treatment may vary due to age or medical condition  Airway Management Planned:   Additional Equipment:   Intra-op Plan:   Post-operative Plan: Extubation in OR  Informed Consent: I have reviewed the patients History and Physical, chart, labs and discussed the procedure including the risks, benefits and alternatives for the proposed anesthesia with the patient or authorized representative who has indicated his/her understanding and acceptance.   Dental advisory given  Plan Discussed with: Anesthesiologist, Surgeon and CRNA  Anesthesia Plan Comments:        Anesthesia Quick Evaluation

## 2017-11-15 NOTE — Anesthesia Postprocedure Evaluation (Signed)
Anesthesia Post Note  Patient: Nancy Anthony  Procedure(s) Performed: LAPAROSCOPIC CHOLECYSTECTOMY WITH INTRAOPERATIVE CHOLANGIOGRAM ERAS PATHWAY (N/A Abdomen)     Patient location during evaluation: PACU Anesthesia Type: General Level of consciousness: awake Pain management: pain level controlled Vital Signs Assessment: post-procedure vital signs reviewed and stable Respiratory status: spontaneous breathing Cardiovascular status: stable Anesthetic complications: no    Last Vitals:  Vitals:   11/15/17 0956 11/15/17 1045  BP: (!) 145/71 139/64  Pulse: (!) 55 74  Resp: 12 18  Temp:  (!) 36.1 C  SpO2: 94% 95%    Last Pain:  Vitals:   11/15/17 0940  TempSrc:   PainSc: 0-No pain                 Khayree Delellis

## 2017-11-15 NOTE — Transfer of Care (Signed)
Immediate Anesthesia Transfer of Care Note  Patient: Nancy Anthony  Procedure(s) Performed: LAPAROSCOPIC CHOLECYSTECTOMY WITH INTRAOPERATIVE CHOLANGIOGRAM ERAS PATHWAY (N/A Abdomen)  Patient Location: PACU  Anesthesia Type:General  Level of Consciousness: awake, alert  and oriented  Airway & Oxygen Therapy: Patient Spontanous Breathing and Patient connected to nasal cannula oxygen  Post-op Assessment: Report given to RN and Post -op Vital signs reviewed and stable  Post vital signs: Reviewed and stable  Last Vitals:  Vitals:   11/15/17 0634  BP: (!) 148/51  Pulse: (!) 57  Resp: 18  Temp: 36.5 C  SpO2: 100%    Last Pain:  Vitals:   11/15/17 0634  TempSrc: Oral  PainSc:          Complications: No apparent anesthesia complications

## 2017-11-15 NOTE — Interval H&P Note (Signed)
History and Physical Interval Note:  11/15/2017 6:46 AM  Nancy Anthony  has presented today for surgery, with the diagnosis of Chronic calculus cholecystitis  The various methods of treatment have been discussed with the patient and family. After consideration of risks, benefits and other options for treatment, the patient has consented to  Procedure(s): LAPAROSCOPIC CHOLECYSTECTOMY WITH INTRAOPERATIVE CHOLANGIOGRAM ERAS PATHWAY (N/A) as a surgical intervention .  The patient's history has been reviewed, patient examined, no change in status, stable for surgery.  I have reviewed the patient's chart and labs.  Questions were answered to the patient's satisfaction.     Maia Petties

## 2017-11-15 NOTE — Anesthesia Procedure Notes (Signed)
Procedure Name: Intubation Date/Time: 11/15/2017 7:29 AM Performed by: Wilburn Cornelia, CRNA Pre-anesthesia Checklist: Patient identified, Emergency Drugs available, Suction available, Patient being monitored and Timeout performed Patient Re-evaluated:Patient Re-evaluated prior to induction Oxygen Delivery Method: Circle system utilized Preoxygenation: Pre-oxygenation with 100% oxygen Induction Type: IV induction Ventilation: Mask ventilation without difficulty Laryngoscope Size: Mac and 3 Grade View: Grade I Tube type: Oral Tube size: 7.0 mm Number of attempts: 1 Airway Equipment and Method: Stylet Placement Confirmation: ETT inserted through vocal cords under direct vision,  positive ETCO2,  CO2 detector and breath sounds checked- equal and bilateral Secured at: 21 cm Tube secured with: Tape Dental Injury: Teeth and Oropharynx as per pre-operative assessment

## 2017-11-16 ENCOUNTER — Encounter (HOSPITAL_COMMUNITY): Payer: Self-pay | Admitting: Surgery

## 2017-11-30 DIAGNOSIS — M791 Myalgia, unspecified site: Secondary | ICD-10-CM | POA: Diagnosis not present

## 2017-11-30 DIAGNOSIS — M542 Cervicalgia: Secondary | ICD-10-CM | POA: Diagnosis not present

## 2017-11-30 DIAGNOSIS — G518 Other disorders of facial nerve: Secondary | ICD-10-CM | POA: Diagnosis not present

## 2017-11-30 DIAGNOSIS — G43719 Chronic migraine without aura, intractable, without status migrainosus: Secondary | ICD-10-CM | POA: Diagnosis not present

## 2017-12-18 DIAGNOSIS — G43011 Migraine without aura, intractable, with status migrainosus: Secondary | ICD-10-CM | POA: Diagnosis not present

## 2017-12-30 DIAGNOSIS — E559 Vitamin D deficiency, unspecified: Secondary | ICD-10-CM | POA: Diagnosis not present

## 2017-12-30 DIAGNOSIS — M810 Age-related osteoporosis without current pathological fracture: Secondary | ICD-10-CM | POA: Diagnosis not present

## 2017-12-30 DIAGNOSIS — Z79899 Other long term (current) drug therapy: Secondary | ICD-10-CM | POA: Diagnosis not present

## 2017-12-30 DIAGNOSIS — E785 Hyperlipidemia, unspecified: Secondary | ICD-10-CM | POA: Diagnosis not present

## 2018-01-11 DIAGNOSIS — G509 Disorder of trigeminal nerve, unspecified: Secondary | ICD-10-CM | POA: Diagnosis not present

## 2018-01-11 DIAGNOSIS — M542 Cervicalgia: Secondary | ICD-10-CM | POA: Diagnosis not present

## 2018-01-11 DIAGNOSIS — M791 Myalgia, unspecified site: Secondary | ICD-10-CM | POA: Diagnosis not present

## 2018-01-11 DIAGNOSIS — G518 Other disorders of facial nerve: Secondary | ICD-10-CM | POA: Diagnosis not present

## 2018-01-11 DIAGNOSIS — G43719 Chronic migraine without aura, intractable, without status migrainosus: Secondary | ICD-10-CM | POA: Diagnosis not present

## 2018-01-11 DIAGNOSIS — R51 Headache: Secondary | ICD-10-CM | POA: Diagnosis not present

## 2018-01-11 DIAGNOSIS — G500 Trigeminal neuralgia: Secondary | ICD-10-CM | POA: Diagnosis not present

## 2018-01-27 DIAGNOSIS — M76892 Other specified enthesopathies of left lower limb, excluding foot: Secondary | ICD-10-CM | POA: Diagnosis not present

## 2018-02-28 DIAGNOSIS — M62552 Muscle wasting and atrophy, not elsewhere classified, left thigh: Secondary | ICD-10-CM | POA: Diagnosis not present

## 2018-02-28 DIAGNOSIS — M25562 Pain in left knee: Secondary | ICD-10-CM | POA: Diagnosis not present

## 2018-02-28 DIAGNOSIS — R2689 Other abnormalities of gait and mobility: Secondary | ICD-10-CM | POA: Diagnosis not present

## 2018-02-28 DIAGNOSIS — M25362 Other instability, left knee: Secondary | ICD-10-CM | POA: Diagnosis not present

## 2018-03-01 DIAGNOSIS — M62552 Muscle wasting and atrophy, not elsewhere classified, left thigh: Secondary | ICD-10-CM | POA: Diagnosis not present

## 2018-03-01 DIAGNOSIS — M25362 Other instability, left knee: Secondary | ICD-10-CM | POA: Diagnosis not present

## 2018-03-01 DIAGNOSIS — R2689 Other abnormalities of gait and mobility: Secondary | ICD-10-CM | POA: Diagnosis not present

## 2018-03-01 DIAGNOSIS — M25562 Pain in left knee: Secondary | ICD-10-CM | POA: Diagnosis not present

## 2018-03-02 DIAGNOSIS — G43719 Chronic migraine without aura, intractable, without status migrainosus: Secondary | ICD-10-CM | POA: Diagnosis not present

## 2018-03-02 DIAGNOSIS — M791 Myalgia, unspecified site: Secondary | ICD-10-CM | POA: Diagnosis not present

## 2018-03-02 DIAGNOSIS — M542 Cervicalgia: Secondary | ICD-10-CM | POA: Diagnosis not present

## 2018-03-02 DIAGNOSIS — G518 Other disorders of facial nerve: Secondary | ICD-10-CM | POA: Diagnosis not present

## 2018-03-03 DIAGNOSIS — M25562 Pain in left knee: Secondary | ICD-10-CM | POA: Diagnosis not present

## 2018-03-03 DIAGNOSIS — M62552 Muscle wasting and atrophy, not elsewhere classified, left thigh: Secondary | ICD-10-CM | POA: Diagnosis not present

## 2018-03-03 DIAGNOSIS — R2689 Other abnormalities of gait and mobility: Secondary | ICD-10-CM | POA: Diagnosis not present

## 2018-03-03 DIAGNOSIS — M25362 Other instability, left knee: Secondary | ICD-10-CM | POA: Diagnosis not present

## 2018-04-10 DIAGNOSIS — L821 Other seborrheic keratosis: Secondary | ICD-10-CM | POA: Diagnosis not present

## 2018-04-10 DIAGNOSIS — L218 Other seborrheic dermatitis: Secondary | ICD-10-CM | POA: Diagnosis not present

## 2018-04-12 DIAGNOSIS — M791 Myalgia, unspecified site: Secondary | ICD-10-CM | POA: Diagnosis not present

## 2018-04-12 DIAGNOSIS — M542 Cervicalgia: Secondary | ICD-10-CM | POA: Diagnosis not present

## 2018-04-12 DIAGNOSIS — G43719 Chronic migraine without aura, intractable, without status migrainosus: Secondary | ICD-10-CM | POA: Diagnosis not present

## 2018-04-12 DIAGNOSIS — R51 Headache: Secondary | ICD-10-CM | POA: Diagnosis not present

## 2018-04-12 DIAGNOSIS — G518 Other disorders of facial nerve: Secondary | ICD-10-CM | POA: Diagnosis not present

## 2018-05-12 ENCOUNTER — Other Ambulatory Visit: Payer: Self-pay | Admitting: Obstetrics and Gynecology

## 2018-05-12 DIAGNOSIS — Z1231 Encounter for screening mammogram for malignant neoplasm of breast: Secondary | ICD-10-CM

## 2018-05-30 DIAGNOSIS — G43909 Migraine, unspecified, not intractable, without status migrainosus: Secondary | ICD-10-CM | POA: Diagnosis not present

## 2018-06-01 DIAGNOSIS — G43719 Chronic migraine without aura, intractable, without status migrainosus: Secondary | ICD-10-CM | POA: Diagnosis not present

## 2018-06-01 DIAGNOSIS — M542 Cervicalgia: Secondary | ICD-10-CM | POA: Diagnosis not present

## 2018-06-01 DIAGNOSIS — M791 Myalgia, unspecified site: Secondary | ICD-10-CM | POA: Diagnosis not present

## 2018-06-01 DIAGNOSIS — G518 Other disorders of facial nerve: Secondary | ICD-10-CM | POA: Diagnosis not present

## 2018-07-24 DIAGNOSIS — M542 Cervicalgia: Secondary | ICD-10-CM | POA: Diagnosis not present

## 2018-07-24 DIAGNOSIS — M810 Age-related osteoporosis without current pathological fracture: Secondary | ICD-10-CM | POA: Diagnosis not present

## 2018-07-24 DIAGNOSIS — G43719 Chronic migraine without aura, intractable, without status migrainosus: Secondary | ICD-10-CM | POA: Diagnosis not present

## 2018-07-24 DIAGNOSIS — R51 Headache: Secondary | ICD-10-CM | POA: Diagnosis not present

## 2018-07-24 DIAGNOSIS — M791 Myalgia, unspecified site: Secondary | ICD-10-CM | POA: Diagnosis not present

## 2018-07-24 DIAGNOSIS — G518 Other disorders of facial nerve: Secondary | ICD-10-CM | POA: Diagnosis not present

## 2018-08-16 DIAGNOSIS — R51 Headache: Secondary | ICD-10-CM | POA: Diagnosis not present

## 2018-08-16 DIAGNOSIS — M9901 Segmental and somatic dysfunction of cervical region: Secondary | ICD-10-CM | POA: Diagnosis not present

## 2018-08-16 DIAGNOSIS — M530 Cervicocranial syndrome: Secondary | ICD-10-CM | POA: Diagnosis not present

## 2018-08-16 DIAGNOSIS — M5032 Other cervical disc degeneration, mid-cervical region, unspecified level: Secondary | ICD-10-CM | POA: Diagnosis not present

## 2018-08-16 DIAGNOSIS — M9902 Segmental and somatic dysfunction of thoracic region: Secondary | ICD-10-CM | POA: Diagnosis not present

## 2018-08-18 DIAGNOSIS — M9902 Segmental and somatic dysfunction of thoracic region: Secondary | ICD-10-CM | POA: Diagnosis not present

## 2018-08-18 DIAGNOSIS — M5032 Other cervical disc degeneration, mid-cervical region, unspecified level: Secondary | ICD-10-CM | POA: Diagnosis not present

## 2018-08-18 DIAGNOSIS — M9901 Segmental and somatic dysfunction of cervical region: Secondary | ICD-10-CM | POA: Diagnosis not present

## 2018-08-18 DIAGNOSIS — M530 Cervicocranial syndrome: Secondary | ICD-10-CM | POA: Diagnosis not present

## 2018-08-18 DIAGNOSIS — R51 Headache: Secondary | ICD-10-CM | POA: Diagnosis not present

## 2018-08-21 DIAGNOSIS — M9901 Segmental and somatic dysfunction of cervical region: Secondary | ICD-10-CM | POA: Diagnosis not present

## 2018-08-21 DIAGNOSIS — M9902 Segmental and somatic dysfunction of thoracic region: Secondary | ICD-10-CM | POA: Diagnosis not present

## 2018-08-21 DIAGNOSIS — M530 Cervicocranial syndrome: Secondary | ICD-10-CM | POA: Diagnosis not present

## 2018-08-21 DIAGNOSIS — M5032 Other cervical disc degeneration, mid-cervical region, unspecified level: Secondary | ICD-10-CM | POA: Diagnosis not present

## 2018-08-21 DIAGNOSIS — R51 Headache: Secondary | ICD-10-CM | POA: Diagnosis not present

## 2018-08-23 ENCOUNTER — Ambulatory Visit
Admission: RE | Admit: 2018-08-23 | Discharge: 2018-08-23 | Disposition: A | Discharge status: Home / Self Care | Payer: Medicare Other | Source: Ambulatory Visit | Attending: Obstetrics and Gynecology | Admitting: Obstetrics and Gynecology

## 2018-08-23 DIAGNOSIS — M9901 Segmental and somatic dysfunction of cervical region: Secondary | ICD-10-CM | POA: Diagnosis not present

## 2018-08-23 DIAGNOSIS — Z1231 Encounter for screening mammogram for malignant neoplasm of breast: Secondary | ICD-10-CM

## 2018-08-23 DIAGNOSIS — R51 Headache: Secondary | ICD-10-CM | POA: Diagnosis not present

## 2018-08-23 DIAGNOSIS — Z23 Encounter for immunization: Secondary | ICD-10-CM | POA: Diagnosis not present

## 2018-08-23 DIAGNOSIS — M9902 Segmental and somatic dysfunction of thoracic region: Secondary | ICD-10-CM | POA: Diagnosis not present

## 2018-08-23 DIAGNOSIS — M5032 Other cervical disc degeneration, mid-cervical region, unspecified level: Secondary | ICD-10-CM | POA: Diagnosis not present

## 2018-08-23 DIAGNOSIS — M530 Cervicocranial syndrome: Secondary | ICD-10-CM | POA: Diagnosis not present

## 2018-08-25 DIAGNOSIS — M9901 Segmental and somatic dysfunction of cervical region: Secondary | ICD-10-CM | POA: Diagnosis not present

## 2018-08-25 DIAGNOSIS — R51 Headache: Secondary | ICD-10-CM | POA: Diagnosis not present

## 2018-08-25 DIAGNOSIS — M530 Cervicocranial syndrome: Secondary | ICD-10-CM | POA: Diagnosis not present

## 2018-08-25 DIAGNOSIS — M5032 Other cervical disc degeneration, mid-cervical region, unspecified level: Secondary | ICD-10-CM | POA: Diagnosis not present

## 2018-08-25 DIAGNOSIS — M9902 Segmental and somatic dysfunction of thoracic region: Secondary | ICD-10-CM | POA: Diagnosis not present

## 2018-08-28 DIAGNOSIS — M9902 Segmental and somatic dysfunction of thoracic region: Secondary | ICD-10-CM | POA: Diagnosis not present

## 2018-08-28 DIAGNOSIS — M530 Cervicocranial syndrome: Secondary | ICD-10-CM | POA: Diagnosis not present

## 2018-08-28 DIAGNOSIS — R51 Headache: Secondary | ICD-10-CM | POA: Diagnosis not present

## 2018-08-28 DIAGNOSIS — M9901 Segmental and somatic dysfunction of cervical region: Secondary | ICD-10-CM | POA: Diagnosis not present

## 2018-08-28 DIAGNOSIS — M5032 Other cervical disc degeneration, mid-cervical region, unspecified level: Secondary | ICD-10-CM | POA: Diagnosis not present

## 2018-08-31 DIAGNOSIS — M542 Cervicalgia: Secondary | ICD-10-CM | POA: Diagnosis not present

## 2018-08-31 DIAGNOSIS — R51 Headache: Secondary | ICD-10-CM | POA: Diagnosis not present

## 2018-08-31 DIAGNOSIS — M9901 Segmental and somatic dysfunction of cervical region: Secondary | ICD-10-CM | POA: Diagnosis not present

## 2018-08-31 DIAGNOSIS — G43719 Chronic migraine without aura, intractable, without status migrainosus: Secondary | ICD-10-CM | POA: Diagnosis not present

## 2018-08-31 DIAGNOSIS — M9902 Segmental and somatic dysfunction of thoracic region: Secondary | ICD-10-CM | POA: Diagnosis not present

## 2018-08-31 DIAGNOSIS — M5032 Other cervical disc degeneration, mid-cervical region, unspecified level: Secondary | ICD-10-CM | POA: Diagnosis not present

## 2018-08-31 DIAGNOSIS — M530 Cervicocranial syndrome: Secondary | ICD-10-CM | POA: Diagnosis not present

## 2018-08-31 DIAGNOSIS — G518 Other disorders of facial nerve: Secondary | ICD-10-CM | POA: Diagnosis not present

## 2018-08-31 DIAGNOSIS — M791 Myalgia, unspecified site: Secondary | ICD-10-CM | POA: Diagnosis not present

## 2018-09-18 DIAGNOSIS — R51 Headache: Secondary | ICD-10-CM | POA: Diagnosis not present

## 2018-09-18 DIAGNOSIS — M9901 Segmental and somatic dysfunction of cervical region: Secondary | ICD-10-CM | POA: Diagnosis not present

## 2018-09-18 DIAGNOSIS — M9902 Segmental and somatic dysfunction of thoracic region: Secondary | ICD-10-CM | POA: Diagnosis not present

## 2018-09-18 DIAGNOSIS — M530 Cervicocranial syndrome: Secondary | ICD-10-CM | POA: Diagnosis not present

## 2018-09-18 DIAGNOSIS — M5032 Other cervical disc degeneration, mid-cervical region, unspecified level: Secondary | ICD-10-CM | POA: Diagnosis not present

## 2018-09-20 DIAGNOSIS — Z124 Encounter for screening for malignant neoplasm of cervix: Secondary | ICD-10-CM | POA: Diagnosis not present

## 2018-09-20 DIAGNOSIS — Z6827 Body mass index (BMI) 27.0-27.9, adult: Secondary | ICD-10-CM | POA: Diagnosis not present

## 2018-09-20 DIAGNOSIS — M9902 Segmental and somatic dysfunction of thoracic region: Secondary | ICD-10-CM | POA: Diagnosis not present

## 2018-09-20 DIAGNOSIS — M9901 Segmental and somatic dysfunction of cervical region: Secondary | ICD-10-CM | POA: Diagnosis not present

## 2018-09-20 DIAGNOSIS — M5032 Other cervical disc degeneration, mid-cervical region, unspecified level: Secondary | ICD-10-CM | POA: Diagnosis not present

## 2018-09-20 DIAGNOSIS — Z01419 Encounter for gynecological examination (general) (routine) without abnormal findings: Secondary | ICD-10-CM | POA: Diagnosis not present

## 2018-09-20 DIAGNOSIS — R51 Headache: Secondary | ICD-10-CM | POA: Diagnosis not present

## 2018-09-20 DIAGNOSIS — M530 Cervicocranial syndrome: Secondary | ICD-10-CM | POA: Diagnosis not present

## 2018-09-25 DIAGNOSIS — M5032 Other cervical disc degeneration, mid-cervical region, unspecified level: Secondary | ICD-10-CM | POA: Diagnosis not present

## 2018-09-25 DIAGNOSIS — M9901 Segmental and somatic dysfunction of cervical region: Secondary | ICD-10-CM | POA: Diagnosis not present

## 2018-09-25 DIAGNOSIS — R51 Headache: Secondary | ICD-10-CM | POA: Diagnosis not present

## 2018-09-25 DIAGNOSIS — M530 Cervicocranial syndrome: Secondary | ICD-10-CM | POA: Diagnosis not present

## 2018-09-25 DIAGNOSIS — M9902 Segmental and somatic dysfunction of thoracic region: Secondary | ICD-10-CM | POA: Diagnosis not present

## 2018-09-27 DIAGNOSIS — M530 Cervicocranial syndrome: Secondary | ICD-10-CM | POA: Diagnosis not present

## 2018-09-27 DIAGNOSIS — R51 Headache: Secondary | ICD-10-CM | POA: Diagnosis not present

## 2018-09-27 DIAGNOSIS — M9901 Segmental and somatic dysfunction of cervical region: Secondary | ICD-10-CM | POA: Diagnosis not present

## 2018-09-27 DIAGNOSIS — M9902 Segmental and somatic dysfunction of thoracic region: Secondary | ICD-10-CM | POA: Diagnosis not present

## 2018-09-27 DIAGNOSIS — M5032 Other cervical disc degeneration, mid-cervical region, unspecified level: Secondary | ICD-10-CM | POA: Diagnosis not present

## 2018-10-02 DIAGNOSIS — M530 Cervicocranial syndrome: Secondary | ICD-10-CM | POA: Diagnosis not present

## 2018-10-02 DIAGNOSIS — M9902 Segmental and somatic dysfunction of thoracic region: Secondary | ICD-10-CM | POA: Diagnosis not present

## 2018-10-02 DIAGNOSIS — M9901 Segmental and somatic dysfunction of cervical region: Secondary | ICD-10-CM | POA: Diagnosis not present

## 2018-10-02 DIAGNOSIS — R51 Headache: Secondary | ICD-10-CM | POA: Diagnosis not present

## 2018-10-02 DIAGNOSIS — M5032 Other cervical disc degeneration, mid-cervical region, unspecified level: Secondary | ICD-10-CM | POA: Diagnosis not present

## 2018-10-04 DIAGNOSIS — M9901 Segmental and somatic dysfunction of cervical region: Secondary | ICD-10-CM | POA: Diagnosis not present

## 2018-10-04 DIAGNOSIS — R51 Headache: Secondary | ICD-10-CM | POA: Diagnosis not present

## 2018-10-04 DIAGNOSIS — M5032 Other cervical disc degeneration, mid-cervical region, unspecified level: Secondary | ICD-10-CM | POA: Diagnosis not present

## 2018-10-04 DIAGNOSIS — M530 Cervicocranial syndrome: Secondary | ICD-10-CM | POA: Diagnosis not present

## 2018-10-04 DIAGNOSIS — M9902 Segmental and somatic dysfunction of thoracic region: Secondary | ICD-10-CM | POA: Diagnosis not present

## 2018-10-05 DIAGNOSIS — G43719 Chronic migraine without aura, intractable, without status migrainosus: Secondary | ICD-10-CM | POA: Diagnosis not present

## 2018-10-05 DIAGNOSIS — G518 Other disorders of facial nerve: Secondary | ICD-10-CM | POA: Diagnosis not present

## 2018-10-05 DIAGNOSIS — M791 Myalgia, unspecified site: Secondary | ICD-10-CM | POA: Diagnosis not present

## 2018-10-05 DIAGNOSIS — M542 Cervicalgia: Secondary | ICD-10-CM | POA: Diagnosis not present

## 2018-10-25 DIAGNOSIS — M9901 Segmental and somatic dysfunction of cervical region: Secondary | ICD-10-CM | POA: Diagnosis not present

## 2018-10-25 DIAGNOSIS — M530 Cervicocranial syndrome: Secondary | ICD-10-CM | POA: Diagnosis not present

## 2018-10-25 DIAGNOSIS — M5032 Other cervical disc degeneration, mid-cervical region, unspecified level: Secondary | ICD-10-CM | POA: Diagnosis not present

## 2018-10-25 DIAGNOSIS — M9902 Segmental and somatic dysfunction of thoracic region: Secondary | ICD-10-CM | POA: Diagnosis not present

## 2018-10-25 DIAGNOSIS — R51 Headache: Secondary | ICD-10-CM | POA: Diagnosis not present

## 2018-11-01 DIAGNOSIS — M5032 Other cervical disc degeneration, mid-cervical region, unspecified level: Secondary | ICD-10-CM | POA: Diagnosis not present

## 2018-11-01 DIAGNOSIS — R51 Headache: Secondary | ICD-10-CM | POA: Diagnosis not present

## 2018-11-01 DIAGNOSIS — M9901 Segmental and somatic dysfunction of cervical region: Secondary | ICD-10-CM | POA: Diagnosis not present

## 2018-11-01 DIAGNOSIS — M9902 Segmental and somatic dysfunction of thoracic region: Secondary | ICD-10-CM | POA: Diagnosis not present

## 2018-11-01 DIAGNOSIS — M530 Cervicocranial syndrome: Secondary | ICD-10-CM | POA: Diagnosis not present

## 2018-11-29 DIAGNOSIS — M791 Myalgia, unspecified site: Secondary | ICD-10-CM | POA: Diagnosis not present

## 2018-11-29 DIAGNOSIS — G518 Other disorders of facial nerve: Secondary | ICD-10-CM | POA: Diagnosis not present

## 2018-11-29 DIAGNOSIS — M542 Cervicalgia: Secondary | ICD-10-CM | POA: Diagnosis not present

## 2018-11-29 DIAGNOSIS — G43719 Chronic migraine without aura, intractable, without status migrainosus: Secondary | ICD-10-CM | POA: Diagnosis not present

## 2019-01-25 DIAGNOSIS — J40 Bronchitis, not specified as acute or chronic: Secondary | ICD-10-CM | POA: Diagnosis not present

## 2019-01-25 DIAGNOSIS — M810 Age-related osteoporosis without current pathological fracture: Secondary | ICD-10-CM | POA: Diagnosis not present

## 2019-01-25 DIAGNOSIS — R509 Fever, unspecified: Secondary | ICD-10-CM | POA: Diagnosis not present

## 2019-02-28 DIAGNOSIS — G518 Other disorders of facial nerve: Secondary | ICD-10-CM | POA: Diagnosis not present

## 2019-02-28 DIAGNOSIS — M542 Cervicalgia: Secondary | ICD-10-CM | POA: Diagnosis not present

## 2019-02-28 DIAGNOSIS — M791 Myalgia, unspecified site: Secondary | ICD-10-CM | POA: Diagnosis not present

## 2019-02-28 DIAGNOSIS — G43719 Chronic migraine without aura, intractable, without status migrainosus: Secondary | ICD-10-CM | POA: Diagnosis not present

## 2019-04-19 DIAGNOSIS — M542 Cervicalgia: Secondary | ICD-10-CM | POA: Diagnosis not present

## 2019-04-19 DIAGNOSIS — G518 Other disorders of facial nerve: Secondary | ICD-10-CM | POA: Diagnosis not present

## 2019-04-19 DIAGNOSIS — M791 Myalgia, unspecified site: Secondary | ICD-10-CM | POA: Diagnosis not present

## 2019-04-19 DIAGNOSIS — G43719 Chronic migraine without aura, intractable, without status migrainosus: Secondary | ICD-10-CM | POA: Diagnosis not present

## 2019-05-24 DIAGNOSIS — G43909 Migraine, unspecified, not intractable, without status migrainosus: Secondary | ICD-10-CM | POA: Diagnosis not present

## 2019-05-30 DIAGNOSIS — G43719 Chronic migraine without aura, intractable, without status migrainosus: Secondary | ICD-10-CM | POA: Diagnosis not present

## 2019-05-30 DIAGNOSIS — M791 Myalgia, unspecified site: Secondary | ICD-10-CM | POA: Diagnosis not present

## 2019-05-30 DIAGNOSIS — G518 Other disorders of facial nerve: Secondary | ICD-10-CM | POA: Diagnosis not present

## 2019-05-30 DIAGNOSIS — M542 Cervicalgia: Secondary | ICD-10-CM | POA: Diagnosis not present

## 2019-06-04 DIAGNOSIS — Z23 Encounter for immunization: Secondary | ICD-10-CM | POA: Diagnosis not present

## 2019-06-07 ENCOUNTER — Other Ambulatory Visit: Payer: Self-pay

## 2019-06-07 ENCOUNTER — Encounter (HOSPITAL_COMMUNITY): Payer: Self-pay | Admitting: Emergency Medicine

## 2019-06-07 ENCOUNTER — Emergency Department (HOSPITAL_COMMUNITY)
Admission: EM | Admit: 2019-06-07 | Discharge: 2019-06-07 | Disposition: A | Discharge status: Home / Self Care | Payer: Medicare Other | Attending: Emergency Medicine | Admitting: Emergency Medicine

## 2019-06-07 DIAGNOSIS — E079 Disorder of thyroid, unspecified: Secondary | ICD-10-CM | POA: Diagnosis not present

## 2019-06-07 DIAGNOSIS — Z87891 Personal history of nicotine dependence: Secondary | ICD-10-CM | POA: Insufficient documentation

## 2019-06-07 DIAGNOSIS — R110 Nausea: Secondary | ICD-10-CM | POA: Diagnosis not present

## 2019-06-07 DIAGNOSIS — Z7982 Long term (current) use of aspirin: Secondary | ICD-10-CM | POA: Diagnosis not present

## 2019-06-07 DIAGNOSIS — M542 Cervicalgia: Secondary | ICD-10-CM | POA: Insufficient documentation

## 2019-06-07 DIAGNOSIS — H538 Other visual disturbances: Secondary | ICD-10-CM | POA: Diagnosis not present

## 2019-06-07 DIAGNOSIS — Z79899 Other long term (current) drug therapy: Secondary | ICD-10-CM | POA: Insufficient documentation

## 2019-06-07 DIAGNOSIS — R519 Headache, unspecified: Secondary | ICD-10-CM | POA: Diagnosis present

## 2019-06-07 DIAGNOSIS — G43109 Migraine with aura, not intractable, without status migrainosus: Secondary | ICD-10-CM | POA: Diagnosis not present

## 2019-06-07 MED ORDER — SODIUM CHLORIDE 0.9 % IV BOLUS
1000.0000 mL | Freq: Once | INTRAVENOUS | Status: AC
  Administered 2019-06-07: 1000 mL via INTRAVENOUS

## 2019-06-07 MED ORDER — METHOCARBAMOL 1000 MG/10ML IJ SOLN
1000.0000 mg | Freq: Once | INTRAMUSCULAR | Status: AC
  Administered 2019-06-07: 10:00:00 1000 mg via INTRAMUSCULAR
  Filled 2019-06-07: qty 10

## 2019-06-07 MED ORDER — ACETAMINOPHEN 500 MG PO TABS
1000.0000 mg | ORAL_TABLET | Freq: Once | ORAL | Status: AC
  Administered 2019-06-07: 10:00:00 1000 mg via ORAL
  Filled 2019-06-07: qty 2

## 2019-06-07 MED ORDER — METOCLOPRAMIDE HCL 5 MG/ML IJ SOLN
10.0000 mg | Freq: Once | INTRAMUSCULAR | Status: AC
  Administered 2019-06-07: 10:00:00 10 mg via INTRAVENOUS
  Filled 2019-06-07: qty 2

## 2019-06-07 MED ORDER — DEXAMETHASONE SODIUM PHOSPHATE 10 MG/ML IJ SOLN
10.0000 mg | Freq: Once | INTRAMUSCULAR | Status: AC
  Administered 2019-06-07: 10:00:00 10 mg via INTRAVENOUS
  Filled 2019-06-07: qty 1

## 2019-06-07 MED ORDER — KETOROLAC TROMETHAMINE 30 MG/ML IJ SOLN
30.0000 mg | Freq: Once | INTRAMUSCULAR | Status: AC
  Administered 2019-06-07: 30 mg via INTRAVENOUS
  Filled 2019-06-07: qty 1

## 2019-06-07 NOTE — ED Provider Notes (Signed)
[facility] DEPT Provider Note   CSN: [id number] Arrival date & time: 06/07/19  0751     History   Chief Complaint Chief Complaint  Patient presents with  . Migraine    HPI Nancy Anthony is a 68 y.o. female with history of migraines presents today for evaluation of headache began yesterday.  It was gradual, mild initially but has progressed and now persistent, 8/10.  Described as throbbing to her temporal head and with radiation to the top of her head.  Last night she noticed pain in her bilateral neck.  States his headache and neck pain have been an issue for her for several years.  She sees Dr. Domingo Cocking for her treatment of her headaches and neck pain.  She is on 2 migraine medicines currently.  She started a new medicine yesterday.  She took this medicine when the headache began and then a second dose but this is not helped.  She is also tried a warm rag, rest, walking.  No alleviating factors.  She has associated photophobia, phonophobia, nausea.  She was not able to sleep last night so she is more fatigued.  Admits to recent stress due to political situation but otherwise denies any known triggers.  No recent head trauma, neck trauma or falls.  No associated fever, stiffness in the neck.  No blood thinners.  No associated dizziness, vision changes, tinnitus, difficulty swallowing, difficulty with speech, balance issues, one-sided numbness or weakness.     HPI  Past Medical History:  Diagnosis Date  . Hashimoto's disease    68 years old  . Hyperlipidemia   . Migraine   . Pneumonia   . Ruptured disk   . Thyroid disease     Patient Active Problem List   Diagnosis Date Noted  . Chronic migraine without aura, intractable, with status migrainosus 08/24/2017  . Hypothyroidism 03/02/2012  . Hx of migraine headaches 03/02/2012  . Allergic rhinitis 03/02/2012  . Dyslipidemia 03/02/2012    Past Surgical History:  Procedure Laterality Date  . BREAST  BIOPSY    . BREAST EXCISIONAL BIOPSY Right 25+ yrs ago   benign  . BREAST EXCISIONAL BIOPSY Left 25+ yrs ago   benign  . CHOLECYSTECTOMY N/A 11/15/2017   Procedure: LAPAROSCOPIC CHOLECYSTECTOMY WITH INTRAOPERATIVE CHOLANGIOGRAM ERAS PATHWAY;  Surgeon: Donnie Mesa, MD;  Location: [facility];  Service: General;  Laterality: N/A;  . COLONOSCOPY    . CYST REMOVAL L EAR Left 1997     OB History   No obstetric history on file.      Home Medications    Prior to Admission medications   Medication Sig Start Date End Date Taking? Authorizing Provider  alendronate (FOSAMAX) 70 MG tablet Take 70 mg by mouth once a week. 05/20/19  Yes [provider]  aspirin 81 MG tablet Take 81 mg by mouth at bedtime.    Yes [provider]  Calcium Citrate-Vitamin D (CALCIUM + D PO) Take 1 tablet by mouth 2 (two) times daily.   Yes [provider]  Capsaicin-Menthol (SALONPAS GEL-PATCH HOT EX) Apply 1 patch topically as needed (back pain).   Yes [provider]  cetirizine (ZYRTEC) 10 MG tablet Take 5 mg by mouth daily.   Yes [provider]  frovatriptan (FROVA) 2.5 MG tablet Take 2.5 mg by mouth as needed for migraine. Weekly PRN   Yes [provider]  ibuprofen (ADVIL) 200 MG tablet Take 400 mg by mouth every 6 (six) hours as  needed for fever, headache or moderate pain.   Yes [provider]  Multiple Vitamin (MULTIVITAMIN WITH MINERALS) TABS tablet Take 1 tablet by mouth daily.   Yes [provider]  OnabotulinumtoxinA (BOTOX IJ) Inject 1 Dose as directed every 3 (three) months. For migraines   Yes [provider]  perphenazine (TRILAFON) 4 MG tablet Take 2 mg by mouth as needed for migraine. PRN every 2 hours 05/30/19  Yes [provider]  PRESCRIPTION MEDICATION Inject 1 Dose as directed every 3 (three) months. Trigger point injection for migraines   Yes [provider]  promethazine (PHENERGAN) 25 MG suppository  Place 25-50 mg rectally as needed for nausea or vomiting.    Yes [provider]  rosuvastatin (CRESTOR) 5 MG tablet Take 2.5 mg by mouth at bedtime.  10/12/17  Yes [provider]  SYNTHROID 88 MCG tablet Take 88 mcg by mouth at bedtime.  10/28/17  Yes [provider]  HYDROcodone-acetaminophen (NORCO/VICODIN) 5-325 MG tablet Take 1-2 tablets by mouth every 6 (six) hours as needed for moderate pain. Patient not taking: Reported on 06/07/2019 10/29/17   Lajean Saver, MD  HYDROcodone-acetaminophen (NORCO/VICODIN) 5-325 MG tablet Take 1 tablet by mouth every 6 (six) hours as needed for moderate pain. Patient not taking: Reported on 06/07/2019 11/15/17   Donnie Mesa, MD  ondansetron (ZOFRAN ODT) 8 MG disintegrating tablet Take 1 tablet (8 mg total) by mouth every 8 (eight) hours as needed for nausea or vomiting. Patient not taking: Reported on 06/07/2019 10/29/17   Lajean Saver, MD    Family History Family History  Problem Relation Age of Onset  . Hyperthyroidism Mother   . Hypothyroidism Father   . Migraines Father   . Prostate cancer Brother   . Hypothyroidism Brother     Social History Social History   Tobacco Use  . Smoking status: Former Smoker    Types: Cigarettes    Quit date: 12/1988    Years since quitting: 30.5  . Smokeless tobacco: Never Used  Substance Use Topics  . Alcohol use: No    Comment: quit 1996  . Drug use: No     Allergies   Codeine, Other, Statins, Tape, and Sulfa antibiotics   Review of Systems Review of Systems  Constitutional: Positive for fatigue.  Eyes: Positive for photophobia.  Gastrointestinal: Positive for nausea.  Musculoskeletal: Positive for myalgias and neck pain.  Neurological: Positive for headaches.       Phonophobia   All other systems reviewed and are negative.    Physical Exam Updated Vital Signs BP (!) 174/89   Pulse 72   Temp 98.6 F (37 C) (Oral)   Resp 18   SpO2 100%   Physical Exam  Constitutional:      Appearance: She is well-developed. She is not toxic-appearing.     Comments: NAD.  HENT:     Head: Normocephalic and atraumatic.     Right Ear: External ear normal.     Left Ear: External ear normal.     Nose: Nose normal. No septal deviation or mucosal edema.  Eyes:     General: Lids are normal.     Conjunctiva/sclera: Conjunctivae normal.     Comments: Unable to visualize back of eye  Neck:     Musculoskeletal: Muscular tenderness present.     Comments: Bilateral trapezius and paraspinal muscle tenderness. No c spine spinous process.  Full chin to chest/PROM of neck w/o rigidity, some pain to lateral neck muscle with  movements. No meningeal signs. No carotid bruits.  Cardiovascular:     Rate and Rhythm: Normal rate and regular rhythm.     Heart sounds: Normal heart sounds.  Pulmonary:     Effort: Pulmonary effort is normal.     Breath sounds: Normal breath sounds.  Lymphadenopathy:     Comments: No cervical adenopathy  Skin:    General: Skin is warm and dry.     Capillary Refill: Capillary refill takes less than 2 seconds.     Findings: No rash.  Neurological:     Mental Status: She is alert.     GCS: GCS eye subscore is 4. GCS verbal subscore is 5. GCS motor subscore is 6.     Comments:   Mental Status: Patient is awake, alert, oriented to person, place, year, and situation. Patient is able to give a clear and coherent history.  Speech is fluent and clear without dysarthria or aphasia.  No signs of neglect.  Cranial Nerves: I not tested II visual fields full bilaterally. PERRL.  Unable to visualize posterior eye. III, IV, VI EOMs intact without ptosis or diplopia  V sensation to light touch intact in all 3 divisions of trigeminal nerve bilaterally  VII facial movements symmetric bilaterally VIII hearing intact to voice/conversation  IX, X no uvula deviation, symmetric rise of soft palate/uvula XI 5/5 SCM and trapezius strength bilaterally  XII  tongue protrusion midline, symmetric L/R movements  Motor: Strength 5/5 in upper/lower extremities .   Sensation to light touch intact in face, upper/lower extremities. No pronator drift. No leg drop.  Cerebellar: No ataxia with finger to nose.   Psychiatric:        Speech: Speech normal.        Behavior: Behavior normal.        Thought Content: Thought content normal.        Judgment: Judgment normal.      ED Treatments / Results  Labs (all labs ordered are listed, but only abnormal results are displayed) Labs Reviewed - No data to display  EKG None  Radiology No results found.  Procedures Procedures (including critical care time)  Medications Ordered in ED Medications  sodium chloride 0.9 % bolus 1,000 mL (1,000 mLs Intravenous New Bag/Given 06/07/19 1040)  dexamethasone (DECADRON) injection 10 mg (10 mg Intravenous Given 06/07/19 1010)  methocarbamol (ROBAXIN) injection 1,000 mg (1,000 mg Intramuscular Given 06/07/19 1022)  metoCLOPramide (REGLAN) injection 10 mg (10 mg Intravenous Given 06/07/19 1009)  ketorolac (TORADOL) 30 MG/ML injection 30 mg (30 mg Intravenous Given 06/07/19 1010)  acetaminophen (TYLENOL) tablet 1,000 mg (1,000 mg Oral Given 06/07/19 1017)     Initial Impression / Assessment and Plan / ED Course  I have reviewed the triage vital signs and the nursing notes.  Pertinent labs & imaging results that were available during my care of the patient were reviewed by me and considered in my medical decision making (see chart for details).  EMR reviewed.  Highest on differential diagnosis is primary headache likely tension type headache, complicated migraine.  No high risk features such as thunderclap/sudden headache, vision disturbances, seizure activity, fever, meningismus, anticoagulant use.  Exam is benign she has no focal neuro deficits, temporal tenderness, meningismus.  Initial blood pressure is elevated but I suspect this is from her pain.  I  considered infectious work-up such as abscess, meningitis unlikely.  Her exam and history is not suggestive of intracranial bleed, stroke, temporal arteritis.  We will give migraine medicines, reassess.  I don't think there is indication for emergent need for imaging at this time.  1230: Pt re-evaluated. She has significant improvement in headache.  Will dc. Return precautions given.  Final Clinical Impressions(s) / ED Diagnoses   Final diagnoses:  Migraine with aura and without status migrainosus, not intractable    ED Discharge Orders    None       Arlean Hopping 06/07/19 1243    Quintella Reichert, MD 06/07/19 [id number]

## 2019-06-07 NOTE — ED Triage Notes (Signed)
Pt c/o migraine started yesterday. Had vomiting this morning. Reports PCP put on new medication for migraines, but not helping.

## 2019-06-07 NOTE — Discharge Instructions (Addendum)
You were seen in the ER for headache  Headache improved significantly here with medicines  Continue migraine medicines at home as directed by your headache doctor

## 2019-06-07 NOTE — ED Notes (Signed)
Pt verbalizes understanding of DC instructions. Pt belongings returned and is ambulatory out of ED.  

## 2019-06-11 DIAGNOSIS — L570 Actinic keratosis: Secondary | ICD-10-CM | POA: Diagnosis not present

## 2019-06-11 DIAGNOSIS — L304 Erythema intertrigo: Secondary | ICD-10-CM | POA: Diagnosis not present

## 2019-06-11 DIAGNOSIS — L821 Other seborrheic keratosis: Secondary | ICD-10-CM | POA: Diagnosis not present

## 2019-06-15 DIAGNOSIS — M791 Myalgia, unspecified site: Secondary | ICD-10-CM | POA: Diagnosis not present

## 2019-06-15 DIAGNOSIS — G43719 Chronic migraine without aura, intractable, without status migrainosus: Secondary | ICD-10-CM | POA: Diagnosis not present

## 2019-06-15 DIAGNOSIS — M542 Cervicalgia: Secondary | ICD-10-CM | POA: Diagnosis not present

## 2019-06-15 DIAGNOSIS — G518 Other disorders of facial nerve: Secondary | ICD-10-CM | POA: Diagnosis not present

## 2019-07-11 DIAGNOSIS — M542 Cervicalgia: Secondary | ICD-10-CM | POA: Diagnosis not present

## 2019-07-11 DIAGNOSIS — G518 Other disorders of facial nerve: Secondary | ICD-10-CM | POA: Diagnosis not present

## 2019-07-11 DIAGNOSIS — M791 Myalgia, unspecified site: Secondary | ICD-10-CM | POA: Diagnosis not present

## 2019-07-11 DIAGNOSIS — G43719 Chronic migraine without aura, intractable, without status migrainosus: Secondary | ICD-10-CM | POA: Diagnosis not present

## 2019-07-18 DIAGNOSIS — N958 Other specified menopausal and perimenopausal disorders: Secondary | ICD-10-CM | POA: Diagnosis not present

## 2019-07-18 DIAGNOSIS — M816 Localized osteoporosis [Lequesne]: Secondary | ICD-10-CM | POA: Diagnosis not present

## 2019-07-24 ENCOUNTER — Other Ambulatory Visit: Payer: Self-pay | Admitting: Obstetrics and Gynecology

## 2019-07-24 DIAGNOSIS — Z1231 Encounter for screening mammogram for malignant neoplasm of breast: Secondary | ICD-10-CM

## 2019-08-07 ENCOUNTER — Emergency Department (HOSPITAL_COMMUNITY)
Admission: EM | Admit: 2019-08-07 | Discharge: 2019-08-07 | Disposition: A | Discharge status: Home / Self Care | Payer: Medicare Other | Attending: Emergency Medicine | Admitting: Emergency Medicine

## 2019-08-07 ENCOUNTER — Encounter (HOSPITAL_COMMUNITY): Payer: Self-pay | Admitting: Emergency Medicine

## 2019-08-07 ENCOUNTER — Other Ambulatory Visit: Payer: Self-pay

## 2019-08-07 DIAGNOSIS — Z79899 Other long term (current) drug therapy: Secondary | ICD-10-CM | POA: Diagnosis not present

## 2019-08-07 DIAGNOSIS — E039 Hypothyroidism, unspecified: Secondary | ICD-10-CM | POA: Insufficient documentation

## 2019-08-07 DIAGNOSIS — Z87891 Personal history of nicotine dependence: Secondary | ICD-10-CM | POA: Diagnosis not present

## 2019-08-07 DIAGNOSIS — Z20828 Contact with and (suspected) exposure to other viral communicable diseases: Secondary | ICD-10-CM | POA: Diagnosis not present

## 2019-08-07 DIAGNOSIS — G43809 Other migraine, not intractable, without status migrainosus: Secondary | ICD-10-CM | POA: Insufficient documentation

## 2019-08-07 DIAGNOSIS — Z7982 Long term (current) use of aspirin: Secondary | ICD-10-CM | POA: Diagnosis not present

## 2019-08-07 DIAGNOSIS — R519 Headache, unspecified: Secondary | ICD-10-CM | POA: Diagnosis present

## 2019-08-07 LAB — POC SARS CORONAVIRUS 2 AG -  ED: SARS Coronavirus 2 Ag: NEGATIVE

## 2019-08-07 MED ORDER — SODIUM CHLORIDE 0.9 % IV BOLUS
1000.0000 mL | Freq: Once | INTRAVENOUS | Status: AC
  Administered 2019-08-07: 1000 mL via INTRAVENOUS

## 2019-08-07 MED ORDER — KETOROLAC TROMETHAMINE 15 MG/ML IJ SOLN
15.0000 mg | Freq: Once | INTRAMUSCULAR | Status: AC
  Administered 2019-08-07: 15 mg via INTRAVENOUS
  Filled 2019-08-07: qty 1

## 2019-08-07 MED ORDER — DEXAMETHASONE SODIUM PHOSPHATE 4 MG/ML IJ SOLN
4.0000 mg | Freq: Once | INTRAMUSCULAR | Status: AC
  Administered 2019-08-07: 4 mg via INTRAVENOUS
  Filled 2019-08-07: qty 1

## 2019-08-07 MED ORDER — METOCLOPRAMIDE HCL 5 MG/ML IJ SOLN
10.0000 mg | Freq: Once | INTRAMUSCULAR | Status: AC
  Administered 2019-08-07: 10 mg via INTRAVENOUS
  Filled 2019-08-07: qty 2

## 2019-08-07 MED ORDER — ONDANSETRON HCL 4 MG/2ML IJ SOLN
4.0000 mg | Freq: Four times a day (QID) | INTRAMUSCULAR | Status: DC | PRN
  Filled 2019-08-07: qty 2

## 2019-08-07 NOTE — ED Triage Notes (Signed)
Patient has had a headache since Thanksgiving, stated she tried all of her migraine and nausea medications, over the last 2 days it has progressively gotten worse. Sine 0430 yesterday she has been vomiting everything up. States pain all over her head that is now traveling down to her neck.

## 2019-08-07 NOTE — ED Provider Notes (Signed)
[facility] DEPT Provider Note   CSN: [id number] Arrival date & time: 08/07/19  0746     History   Chief Complaint Chief Complaint  Patient presents with  . Headache  . Emesis    HPI Nancy Anthony is a 68 y.o. female history of chronic migraines for which she receives Botox injections, presenting to the ED with a migraine.  She reports symptom onset yesterday morning when she woke up.  She describes the pain in her typical presentation is a throbbing stabbing pain in her head.  It is associated with nausea, photophobia, vomiting.  She says this is typical for her migraines, although she feels that the vomiting is worse this time around.  She tried taking her abortive medications at home (frova) and phenergan for nausea but has had no relief.  She otherwise denies neck stiffness, fevers, chills, respiratory symptoms.  She reports no known sick contacts.  She denies any shortness of breath or coughing.       HPI  Past Medical History:  Diagnosis Date  . Hashimoto's disease    68 years old  . Hyperlipidemia   . Migraine   . Pneumonia   . Ruptured disk   . Thyroid disease     Patient Active Problem List   Diagnosis Date Noted  . Chronic migraine without aura, intractable, with status migrainosus 08/24/2017  . Hypothyroidism 03/02/2012  . Hx of migraine headaches 03/02/2012  . Allergic rhinitis 03/02/2012  . Dyslipidemia 03/02/2012    Past Surgical History:  Procedure Laterality Date  . BREAST BIOPSY    . BREAST EXCISIONAL BIOPSY Right 25+ yrs ago   benign  . BREAST EXCISIONAL BIOPSY Left 25+ yrs ago   benign  . CHOLECYSTECTOMY N/A 11/15/2017   Procedure: LAPAROSCOPIC CHOLECYSTECTOMY WITH INTRAOPERATIVE CHOLANGIOGRAM ERAS PATHWAY;  Surgeon: Donnie Mesa, MD;  Location: [facility];  Service: General;  Laterality: N/A;  . COLONOSCOPY    . CYST REMOVAL L EAR Left 1997     OB History   No obstetric history on file.      Home  Medications    Prior to Admission medications   Medication Sig Start Date End Date Taking? Authorizing Provider  alendronate (FOSAMAX) 70 MG tablet Take 70 mg by mouth once a week. 05/20/19   [provider]  aspirin 81 MG tablet Take 81 mg by mouth at bedtime.     [provider]  Calcium Citrate-Vitamin D (CALCIUM + D PO) Take 1 tablet by mouth 2 (two) times daily.    [provider]  Capsaicin-Menthol (SALONPAS GEL-PATCH HOT EX) Apply 1 patch topically as needed (back pain).    [provider]  cetirizine (ZYRTEC) 10 MG tablet Take 5 mg by mouth daily.    [provider]  frovatriptan (FROVA) 2.5 MG tablet Take 2.5 mg by mouth as needed for migraine. Weekly PRN    [provider]  HYDROcodone-acetaminophen (NORCO/VICODIN) 5-325 MG tablet Take 1-2 tablets by mouth every 6 (six) hours as needed for moderate pain. Patient not taking: Reported on 06/07/2019 10/29/17   Lajean Saver, MD  HYDROcodone-acetaminophen (NORCO/VICODIN) 5-325 MG tablet Take 1 tablet by mouth every 6 (six) hours as needed for moderate pain. Patient not taking: Reported on 06/07/2019 11/15/17   Donnie Mesa, MD  ibuprofen (ADVIL) 200 MG tablet Take 400 mg by mouth every 6 (six) hours as needed for fever, headache or moderate pain.    [provider]  Multiple Vitamin (MULTIVITAMIN WITH  MINERALS) TABS tablet Take 1 tablet by mouth daily.    [provider]  OnabotulinumtoxinA (BOTOX IJ) Inject 1 Dose as directed every 3 (three) months. For migraines    [provider]  ondansetron (ZOFRAN ODT) 8 MG disintegrating tablet Take 1 tablet (8 mg total) by mouth every 8 (eight) hours as needed for nausea or vomiting. Patient not taking: Reported on 06/07/2019 10/29/17   Lajean Saver, MD  perphenazine (TRILAFON) 4 MG tablet Take 2 mg by mouth as needed for migraine. PRN every 2 hours 05/30/19   [provider]  PRESCRIPTION MEDICATION Inject 1 Dose  as directed every 3 (three) months. Trigger point injection for migraines    [provider]  promethazine (PHENERGAN) 25 MG suppository Place 25-50 mg rectally as needed for nausea or vomiting.     [provider]  rosuvastatin (CRESTOR) 5 MG tablet Take 2.5 mg by mouth at bedtime.  10/12/17   [provider]  SYNTHROID 88 MCG tablet Take 88 mcg by mouth at bedtime.  10/28/17   [provider]    Family History Family History  Problem Relation Age of Onset  . Hyperthyroidism Mother   . Hypothyroidism Father   . Migraines Father   . Prostate cancer Brother   . Hypothyroidism Brother     Social History Social History   Tobacco Use  . Smoking status: Former Smoker    Types: Cigarettes    Quit date: 12/1988    Years since quitting: 30.6  . Smokeless tobacco: Never Used  Substance Use Topics  . Alcohol use: No    Comment: quit 1996  . Drug use: No     Allergies   Codeine, Other, Statins, Tape, and Sulfa antibiotics   Review of Systems Review of Systems  Constitutional: Negative for chills and fever.  Eyes: Positive for photophobia. Negative for redness and visual disturbance.  Respiratory: Negative for cough, chest tightness and shortness of breath.   Cardiovascular: Negative for chest pain and palpitations.  Gastrointestinal: Positive for nausea and vomiting.  Musculoskeletal: Negative for arthralgias and myalgias.  Skin: Negative for pallor and rash.  Neurological: Positive for headaches. Negative for seizures, syncope, facial asymmetry and light-headedness.  Psychiatric/Behavioral: Negative for agitation and confusion.  All other systems reviewed and are negative.    Physical Exam Updated Vital Signs BP (!) 146/77   Pulse 73   Temp 98.7 F (37.1 C) (Oral)   Resp 15   Ht 5' 2.5" (1.588 m)   Wt 72.1 kg   SpO2 98%   BMI 28.62 kg/m   Physical Exam Vitals signs and nursing note reviewed.  Constitutional:      General: She  is not in acute distress.    Appearance: She is well-developed. She is not ill-appearing.     Comments: Wearing sunglasses  HENT:     Head: Normocephalic and atraumatic.  Eyes:     Conjunctiva/sclera: Conjunctivae normal.  Neck:     Musculoskeletal: Normal range of motion and neck supple. No neck rigidity.     Meningeal: Kernig's sign absent.  Cardiovascular:     Rate and Rhythm: Normal rate and regular rhythm.     Heart sounds: Normal heart sounds.  Pulmonary:     Effort: Pulmonary effort is normal. No respiratory distress.     Breath sounds: Normal breath sounds.     Comments: Pulse ox 95% on room air Abdominal:     Palpations: Abdomen is soft.     Tenderness:  There is no abdominal tenderness.  Skin:    General: Skin is warm and dry.  Neurological:     Mental Status: She is alert.     GCS: GCS eye subscore is 4. GCS verbal subscore is 5. GCS motor subscore is 6.     Cranial Nerves: No cranial nerve deficit or dysarthria.     Sensory: No sensory deficit.     Motor: No weakness.  Psychiatric:        Mood and Affect: Mood normal.        Behavior: Behavior normal.      ED Treatments / Results  Labs (all labs ordered are listed, but only abnormal results are displayed) Labs Reviewed  POC SARS CORONAVIRUS 2 AG -  ED    EKG None  Radiology No results found.  Procedures Procedures (including critical care time)  Medications Ordered in ED Medications  ondansetron (ZOFRAN) injection 4 mg (has no administration in time range)  dexamethasone (DECADRON) injection 4 mg (4 mg Intravenous Given 08/07/19 0954)  metoCLOPramide (REGLAN) injection 10 mg (10 mg Intravenous Given 08/07/19 0954)  ketorolac (TORADOL) 15 MG/ML injection 15 mg (15 mg Intravenous Given 08/07/19 0954)  sodium chloride 0.9 % bolus 1,000 mL (0 mLs Intravenous Stopped 08/07/19 1033)     Initial Impression / Assessment and Plan / ED Course  I have reviewed the triage vital signs and the nursing notes.   Pertinent labs & imaging results that were available during my care of the patient were reviewed by me and considered in my medical decision making (see chart for details).   68 year old female with a history of migraines presenting to the ED with recurrent migraine.  Symptoms began yesterday.  She complains of nausea and vomiting difficulty keeping down fluids and feeling dehydrated.  Clinically she is fairly well-appearing on exam.  She does not appear to be in overt distress.  There are no signs or symptoms of meningitis or cranial infection.  I have very low suspicion for intracranial hemorrhage, this appears to be her typical migraine pattern.  We will give her an IV migraine cocktail including Decadron.  I'll give her some IV fluids.`  We also asked that she get a rapid Covid test given that she is satting 95% on room air.  She has no respiratory symptoms or abnormal lung findings, but also has no known lung disease to account for this.  No smoking hx  Nancy Anthony was evaluated in Emergency Department on 08/07/2019 for the symptoms described in the history of present illness. She was evaluated in the context of the global COVID-19 pandemic, which necessitated consideration that the patient might be at risk for infection with the SARS-CoV-2 virus that causes COVID-19. Institutional protocols and algorithms that pertain to the evaluation of patients at risk for COVID-19 are in a state of rapid change based on information released by regulatory bodies including the CDC and federal and state organizations. These policies and algorithms were followed during the patient's care in the ED.  Clinical Course as of Aug 06 1332  Tue Aug 07, 2019  1141 Pt is feeling significantly better, drank and tolerated wate,r will d/c   [MT]    Clinical Course User Index [MT] Zora Glendenning, Carola Rhine, MD    Final Clinical Impressions(s) / ED Diagnoses   Final diagnoses:  Other migraine without status migrainosus,  not intractable    ED Discharge Orders    None       Aadhav Uhlig, Carola Rhine,  MD 08/07/19 1333

## 2019-08-07 NOTE — ED Notes (Signed)
Patient given water and encouraged to drink. 

## 2019-08-23 DIAGNOSIS — E039 Hypothyroidism, unspecified: Secondary | ICD-10-CM | POA: Diagnosis not present

## 2019-08-23 DIAGNOSIS — M810 Age-related osteoporosis without current pathological fracture: Secondary | ICD-10-CM | POA: Diagnosis not present

## 2019-08-23 DIAGNOSIS — G43909 Migraine, unspecified, not intractable, without status migrainosus: Secondary | ICD-10-CM | POA: Diagnosis not present

## 2019-08-23 DIAGNOSIS — E785 Hyperlipidemia, unspecified: Secondary | ICD-10-CM | POA: Diagnosis not present

## 2019-08-23 DIAGNOSIS — Z23 Encounter for immunization: Secondary | ICD-10-CM | POA: Diagnosis not present

## 2019-08-23 DIAGNOSIS — Z0000 Encounter for general adult medical examination without abnormal findings: Secondary | ICD-10-CM | POA: Diagnosis not present

## 2019-08-23 DIAGNOSIS — R197 Diarrhea, unspecified: Secondary | ICD-10-CM | POA: Diagnosis not present

## 2019-08-23 DIAGNOSIS — E559 Vitamin D deficiency, unspecified: Secondary | ICD-10-CM | POA: Diagnosis not present

## 2019-09-17 ENCOUNTER — Other Ambulatory Visit: Payer: Self-pay

## 2019-09-17 ENCOUNTER — Ambulatory Visit
Admission: RE | Admit: 2019-09-17 | Discharge: 2019-09-17 | Disposition: A | Discharge status: Home / Self Care | Payer: Medicare Other | Source: Ambulatory Visit | Attending: Obstetrics and Gynecology | Admitting: Obstetrics and Gynecology

## 2019-09-17 DIAGNOSIS — Z1231 Encounter for screening mammogram for malignant neoplasm of breast: Secondary | ICD-10-CM

## 2019-09-19 DIAGNOSIS — G43719 Chronic migraine without aura, intractable, without status migrainosus: Secondary | ICD-10-CM | POA: Diagnosis not present

## 2019-09-29 ENCOUNTER — Ambulatory Visit: Payer: Medicare Other | Attending: Internal Medicine

## 2019-09-29 DIAGNOSIS — Z23 Encounter for immunization: Secondary | ICD-10-CM

## 2019-09-29 NOTE — Progress Notes (Signed)
   Covid-19 Vaccination Clinic  Name:  Seniya Gouker    MRN: [id number] DOB: 1951/02/04  09/29/2019  Ms. Parke was observed post Covid-19 immunization for 15 minutes without incidence. She was provided with Vaccine Information Sheet and instruction to access the V-Safe system.   Ms. Hucke was instructed to call 911 with any severe reactions post vaccine: Marland Kitchen Difficulty breathing  . Swelling of your face and throat  . A fast heartbeat  . A bad rash all over your body  . Dizziness and weakness    Immunizations Administered    Name Date Dose VIS Date Route   Pfizer COVID-19 Vaccine 09/29/2019  2:55 PM 0.3 mL 08/17/2019 Intramuscular   Manufacturer: [place]   Lot: [id number]   [place]: [id number]

## 2019-10-21 ENCOUNTER — Ambulatory Visit: Payer: Medicare Other | Attending: Internal Medicine

## 2019-10-21 DIAGNOSIS — Z23 Encounter for immunization: Secondary | ICD-10-CM | POA: Insufficient documentation

## 2019-10-21 NOTE — Progress Notes (Signed)
   Covid-19 Vaccination Clinic  Name:  Nancy Anthony    MRN: [id number] DOB: 31-Oct-1950  10/21/2019  [name] was observed post Covid-19 immunization for 15 minutes without incidence. She was provided with Vaccine Information Sheet and instruction to access the V-Safe system.   [name] was instructed to call 911 with any severe reactions post vaccine: Marland Kitchen Difficulty breathing  . Swelling of your face and throat  . A fast heartbeat  . A bad rash all over your body  . Dizziness and weakness    Immunizations Administered    Name Date Dose VIS Date Route   Pfizer COVID-19 Vaccine 10/21/2019 10:58 AM 0.3 mL 08/17/2019 Intramuscular   Manufacturer: [place]   Lot: [id number]   [place]: [id number]

## 2019-10-31 DIAGNOSIS — G43719 Chronic migraine without aura, intractable, without status migrainosus: Secondary | ICD-10-CM | POA: Diagnosis not present

## 2020-01-28 DIAGNOSIS — Z03818 Encounter for observation for suspected exposure to other biological agents ruled out: Secondary | ICD-10-CM | POA: Diagnosis not present

## 2020-01-29 ENCOUNTER — Encounter (HOSPITAL_COMMUNITY): Payer: Self-pay | Admitting: *Deleted

## 2020-01-29 ENCOUNTER — Other Ambulatory Visit: Payer: Self-pay

## 2020-01-29 DIAGNOSIS — R112 Nausea with vomiting, unspecified: Secondary | ICD-10-CM | POA: Insufficient documentation

## 2020-01-29 DIAGNOSIS — Z5321 Procedure and treatment not carried out due to patient leaving prior to being seen by health care provider: Secondary | ICD-10-CM | POA: Insufficient documentation

## 2020-01-29 DIAGNOSIS — R519 Headache, unspecified: Secondary | ICD-10-CM | POA: Diagnosis not present

## 2020-01-29 MED ORDER — ONDANSETRON 4 MG PO TBDP
4.0000 mg | ORAL_TABLET | Freq: Once | ORAL | Status: AC | PRN
  Administered 2020-01-29: 4 mg via ORAL
  Filled 2020-01-29: qty 1

## 2020-01-29 NOTE — ED Triage Notes (Signed)
Pt ambulatory to triage stating headache for 5 days, associated with nausea and vomiting.  Denies fevers or visual changes.

## 2020-01-30 ENCOUNTER — Emergency Department (HOSPITAL_COMMUNITY)
Admission: EM | Admit: 2020-01-30 | Discharge: 2020-01-30 | Disposition: A | Discharge status: Home / Self Care | Payer: Medicare Other | Attending: Emergency Medicine | Admitting: Emergency Medicine

## 2020-01-30 DIAGNOSIS — G43719 Chronic migraine without aura, intractable, without status migrainosus: Secondary | ICD-10-CM | POA: Diagnosis not present

## 2020-01-30 DIAGNOSIS — M542 Cervicalgia: Secondary | ICD-10-CM | POA: Diagnosis not present

## 2020-01-30 DIAGNOSIS — M791 Myalgia, unspecified site: Secondary | ICD-10-CM | POA: Diagnosis not present

## 2020-01-30 DIAGNOSIS — G518 Other disorders of facial nerve: Secondary | ICD-10-CM | POA: Diagnosis not present

## 2020-01-30 NOTE — ED Notes (Signed)
No answer to take to treatment room

## 2020-04-07 DIAGNOSIS — M5136 Other intervertebral disc degeneration, lumbar region: Secondary | ICD-10-CM | POA: Diagnosis not present

## 2020-04-07 DIAGNOSIS — M25551 Pain in right hip: Secondary | ICD-10-CM | POA: Diagnosis not present

## 2020-04-07 DIAGNOSIS — M25552 Pain in left hip: Secondary | ICD-10-CM | POA: Diagnosis not present

## 2020-04-07 DIAGNOSIS — G43909 Migraine, unspecified, not intractable, without status migrainosus: Secondary | ICD-10-CM | POA: Diagnosis not present

## 2020-04-07 DIAGNOSIS — I10 Essential (primary) hypertension: Secondary | ICD-10-CM | POA: Diagnosis not present

## 2020-04-22 DIAGNOSIS — G43719 Chronic migraine without aura, intractable, without status migrainosus: Secondary | ICD-10-CM | POA: Diagnosis not present

## 2020-05-02 DIAGNOSIS — M545 Low back pain: Secondary | ICD-10-CM | POA: Diagnosis not present

## 2020-05-02 DIAGNOSIS — M5416 Radiculopathy, lumbar region: Secondary | ICD-10-CM | POA: Diagnosis not present

## 2020-05-05 DIAGNOSIS — M545 Low back pain: Secondary | ICD-10-CM | POA: Diagnosis not present

## 2020-05-05 DIAGNOSIS — M5416 Radiculopathy, lumbar region: Secondary | ICD-10-CM | POA: Diagnosis not present

## 2020-05-09 DIAGNOSIS — Z23 Encounter for immunization: Secondary | ICD-10-CM | POA: Diagnosis not present

## 2020-05-09 DIAGNOSIS — M545 Low back pain: Secondary | ICD-10-CM | POA: Diagnosis not present

## 2020-05-09 DIAGNOSIS — M5416 Radiculopathy, lumbar region: Secondary | ICD-10-CM | POA: Diagnosis not present

## 2020-05-15 DIAGNOSIS — M5416 Radiculopathy, lumbar region: Secondary | ICD-10-CM | POA: Diagnosis not present

## 2020-05-20 DIAGNOSIS — M5416 Radiculopathy, lumbar region: Secondary | ICD-10-CM | POA: Diagnosis not present

## 2020-06-03 DIAGNOSIS — M5416 Radiculopathy, lumbar region: Secondary | ICD-10-CM | POA: Diagnosis not present

## 2020-06-05 DIAGNOSIS — M5416 Radiculopathy, lumbar region: Secondary | ICD-10-CM | POA: Diagnosis not present

## 2020-06-10 DIAGNOSIS — M5416 Radiculopathy, lumbar region: Secondary | ICD-10-CM | POA: Diagnosis not present

## 2020-06-12 DIAGNOSIS — M791 Myalgia, unspecified site: Secondary | ICD-10-CM | POA: Diagnosis not present

## 2020-06-12 DIAGNOSIS — G43909 Migraine, unspecified, not intractable, without status migrainosus: Secondary | ICD-10-CM | POA: Diagnosis not present

## 2020-06-12 DIAGNOSIS — G43719 Chronic migraine without aura, intractable, without status migrainosus: Secondary | ICD-10-CM | POA: Diagnosis not present

## 2020-06-12 DIAGNOSIS — Z1159 Encounter for screening for other viral diseases: Secondary | ICD-10-CM | POA: Diagnosis not present

## 2020-06-12 DIAGNOSIS — M542 Cervicalgia: Secondary | ICD-10-CM | POA: Diagnosis not present

## 2020-06-12 DIAGNOSIS — E039 Hypothyroidism, unspecified: Secondary | ICD-10-CM | POA: Diagnosis not present

## 2020-06-12 DIAGNOSIS — E559 Vitamin D deficiency, unspecified: Secondary | ICD-10-CM | POA: Diagnosis not present

## 2020-06-12 DIAGNOSIS — E785 Hyperlipidemia, unspecified: Secondary | ICD-10-CM | POA: Diagnosis not present

## 2020-06-12 DIAGNOSIS — G518 Other disorders of facial nerve: Secondary | ICD-10-CM | POA: Diagnosis not present

## 2020-06-12 DIAGNOSIS — M5136 Other intervertebral disc degeneration, lumbar region: Secondary | ICD-10-CM | POA: Diagnosis not present

## 2020-06-12 DIAGNOSIS — I10 Essential (primary) hypertension: Secondary | ICD-10-CM | POA: Diagnosis not present

## 2020-06-13 DIAGNOSIS — M5416 Radiculopathy, lumbar region: Secondary | ICD-10-CM | POA: Diagnosis not present

## 2020-06-17 DIAGNOSIS — M5451 Vertebrogenic low back pain: Secondary | ICD-10-CM | POA: Diagnosis not present

## 2020-06-17 DIAGNOSIS — M5416 Radiculopathy, lumbar region: Secondary | ICD-10-CM | POA: Diagnosis not present

## 2020-06-20 DIAGNOSIS — M5416 Radiculopathy, lumbar region: Secondary | ICD-10-CM | POA: Diagnosis not present

## 2020-06-24 DIAGNOSIS — M5416 Radiculopathy, lumbar region: Secondary | ICD-10-CM | POA: Diagnosis not present

## 2020-06-24 DIAGNOSIS — L218 Other seborrheic dermatitis: Secondary | ICD-10-CM | POA: Diagnosis not present

## 2020-06-24 DIAGNOSIS — L821 Other seborrheic keratosis: Secondary | ICD-10-CM | POA: Diagnosis not present

## 2020-06-24 DIAGNOSIS — L814 Other melanin hyperpigmentation: Secondary | ICD-10-CM | POA: Diagnosis not present

## 2020-06-26 DIAGNOSIS — N390 Urinary tract infection, site not specified: Secondary | ICD-10-CM | POA: Diagnosis not present

## 2020-06-26 DIAGNOSIS — R350 Frequency of micturition: Secondary | ICD-10-CM | POA: Diagnosis not present

## 2020-07-18 DIAGNOSIS — M5416 Radiculopathy, lumbar region: Secondary | ICD-10-CM | POA: Diagnosis not present

## 2020-07-22 DIAGNOSIS — M5416 Radiculopathy, lumbar region: Secondary | ICD-10-CM | POA: Diagnosis not present

## 2020-07-24 DIAGNOSIS — M5416 Radiculopathy, lumbar region: Secondary | ICD-10-CM | POA: Diagnosis not present

## 2020-07-29 DIAGNOSIS — M5416 Radiculopathy, lumbar region: Secondary | ICD-10-CM | POA: Diagnosis not present

## 2020-08-05 DIAGNOSIS — M5416 Radiculopathy, lumbar region: Secondary | ICD-10-CM | POA: Diagnosis not present

## 2020-08-12 DIAGNOSIS — M5416 Radiculopathy, lumbar region: Secondary | ICD-10-CM | POA: Diagnosis not present

## 2020-08-13 DIAGNOSIS — E039 Hypothyroidism, unspecified: Secondary | ICD-10-CM | POA: Diagnosis not present

## 2020-08-13 DIAGNOSIS — I10 Essential (primary) hypertension: Secondary | ICD-10-CM | POA: Diagnosis not present

## 2020-08-13 DIAGNOSIS — M5136 Other intervertebral disc degeneration, lumbar region: Secondary | ICD-10-CM | POA: Diagnosis not present

## 2020-08-14 DIAGNOSIS — R300 Dysuria: Secondary | ICD-10-CM | POA: Diagnosis not present

## 2020-08-15 ENCOUNTER — Other Ambulatory Visit: Payer: Self-pay | Admitting: Family Medicine

## 2020-08-15 DIAGNOSIS — M5136 Other intervertebral disc degeneration, lumbar region: Secondary | ICD-10-CM

## 2020-08-17 ENCOUNTER — Other Ambulatory Visit: Payer: Self-pay

## 2020-08-17 ENCOUNTER — Ambulatory Visit
Admission: RE | Admit: 2020-08-17 | Discharge: 2020-08-17 | Disposition: A | Discharge status: Home / Self Care | Payer: Medicare Other | Source: Ambulatory Visit | Attending: Family Medicine | Admitting: Family Medicine

## 2020-08-17 DIAGNOSIS — M5136 Other intervertebral disc degeneration, lumbar region: Secondary | ICD-10-CM

## 2020-08-17 DIAGNOSIS — M545 Low back pain, unspecified: Secondary | ICD-10-CM | POA: Diagnosis not present

## 2020-08-17 DIAGNOSIS — M48061 Spinal stenosis, lumbar region without neurogenic claudication: Secondary | ICD-10-CM | POA: Diagnosis not present

## 2020-08-19 DIAGNOSIS — M5451 Vertebrogenic low back pain: Secondary | ICD-10-CM | POA: Diagnosis not present

## 2020-08-19 DIAGNOSIS — M5416 Radiculopathy, lumbar region: Secondary | ICD-10-CM | POA: Diagnosis not present

## 2020-08-21 DIAGNOSIS — I10 Essential (primary) hypertension: Secondary | ICD-10-CM | POA: Diagnosis not present

## 2020-08-21 DIAGNOSIS — M5136 Other intervertebral disc degeneration, lumbar region: Secondary | ICD-10-CM | POA: Diagnosis not present

## 2020-08-21 DIAGNOSIS — E039 Hypothyroidism, unspecified: Secondary | ICD-10-CM | POA: Diagnosis not present

## 2020-08-21 DIAGNOSIS — Z0000 Encounter for general adult medical examination without abnormal findings: Secondary | ICD-10-CM | POA: Diagnosis not present

## 2020-08-21 DIAGNOSIS — G43909 Migraine, unspecified, not intractable, without status migrainosus: Secondary | ICD-10-CM | POA: Diagnosis not present

## 2020-08-21 DIAGNOSIS — E559 Vitamin D deficiency, unspecified: Secondary | ICD-10-CM | POA: Diagnosis not present

## 2020-08-21 DIAGNOSIS — E785 Hyperlipidemia, unspecified: Secondary | ICD-10-CM | POA: Diagnosis not present

## 2020-08-21 DIAGNOSIS — M810 Age-related osteoporosis without current pathological fracture: Secondary | ICD-10-CM | POA: Diagnosis not present

## 2020-09-04 DIAGNOSIS — Z20822 Contact with and (suspected) exposure to covid-19: Secondary | ICD-10-CM | POA: Diagnosis not present

## 2020-09-12 ENCOUNTER — Other Ambulatory Visit: Payer: Self-pay | Admitting: Obstetrics and Gynecology

## 2020-09-12 DIAGNOSIS — Z1231 Encounter for screening mammogram for malignant neoplasm of breast: Secondary | ICD-10-CM

## 2020-09-26 DIAGNOSIS — Z23 Encounter for immunization: Secondary | ICD-10-CM | POA: Diagnosis not present

## 2020-10-20 DIAGNOSIS — M47816 Spondylosis without myelopathy or radiculopathy, lumbar region: Secondary | ICD-10-CM | POA: Diagnosis not present

## 2020-10-22 ENCOUNTER — Ambulatory Visit: Payer: Medicare Other

## 2020-10-23 ENCOUNTER — Ambulatory Visit: Payer: Medicare Other

## 2020-11-06 DIAGNOSIS — M47816 Spondylosis without myelopathy or radiculopathy, lumbar region: Secondary | ICD-10-CM | POA: Diagnosis not present

## 2020-11-24 DIAGNOSIS — M5416 Radiculopathy, lumbar region: Secondary | ICD-10-CM | POA: Diagnosis not present

## 2020-12-04 DIAGNOSIS — M5416 Radiculopathy, lumbar region: Secondary | ICD-10-CM | POA: Diagnosis not present

## 2020-12-09 ENCOUNTER — Ambulatory Visit
Admission: RE | Admit: 2020-12-09 | Discharge: 2020-12-09 | Disposition: A | Discharge status: Home / Self Care | Payer: Medicare Other | Source: Ambulatory Visit | Attending: Obstetrics and Gynecology | Admitting: Obstetrics and Gynecology

## 2020-12-09 ENCOUNTER — Other Ambulatory Visit: Payer: Self-pay

## 2020-12-09 DIAGNOSIS — Z1231 Encounter for screening mammogram for malignant neoplasm of breast: Secondary | ICD-10-CM

## 2020-12-09 DIAGNOSIS — G43719 Chronic migraine without aura, intractable, without status migrainosus: Secondary | ICD-10-CM | POA: Diagnosis not present

## 2021-01-05 DIAGNOSIS — M533 Sacrococcygeal disorders, not elsewhere classified: Secondary | ICD-10-CM | POA: Diagnosis not present

## 2021-01-09 DIAGNOSIS — R197 Diarrhea, unspecified: Secondary | ICD-10-CM | POA: Diagnosis not present

## 2021-01-09 DIAGNOSIS — Z86010 Personal history of colonic polyps: Secondary | ICD-10-CM | POA: Diagnosis not present

## 2021-01-13 DIAGNOSIS — R197 Diarrhea, unspecified: Secondary | ICD-10-CM | POA: Diagnosis not present

## 2021-01-13 DIAGNOSIS — Z86010 Personal history of colonic polyps: Secondary | ICD-10-CM | POA: Diagnosis not present

## 2021-01-22 DIAGNOSIS — M533 Sacrococcygeal disorders, not elsewhere classified: Secondary | ICD-10-CM | POA: Diagnosis not present

## 2021-02-06 DIAGNOSIS — M791 Myalgia, unspecified site: Secondary | ICD-10-CM | POA: Diagnosis not present

## 2021-02-06 DIAGNOSIS — R1030 Lower abdominal pain, unspecified: Secondary | ICD-10-CM | POA: Diagnosis not present

## 2021-02-06 DIAGNOSIS — M542 Cervicalgia: Secondary | ICD-10-CM | POA: Diagnosis not present

## 2021-02-06 DIAGNOSIS — R197 Diarrhea, unspecified: Secondary | ICD-10-CM | POA: Diagnosis not present

## 2021-02-06 DIAGNOSIS — G518 Other disorders of facial nerve: Secondary | ICD-10-CM | POA: Diagnosis not present

## 2021-02-06 DIAGNOSIS — G43719 Chronic migraine without aura, intractable, without status migrainosus: Secondary | ICD-10-CM | POA: Diagnosis not present

## 2021-02-13 DIAGNOSIS — M533 Sacrococcygeal disorders, not elsewhere classified: Secondary | ICD-10-CM | POA: Diagnosis not present

## 2021-03-12 DIAGNOSIS — Z20822 Contact with and (suspected) exposure to covid-19: Secondary | ICD-10-CM | POA: Diagnosis not present

## 2021-03-12 DIAGNOSIS — Z23 Encounter for immunization: Secondary | ICD-10-CM | POA: Diagnosis not present

## 2021-03-20 DIAGNOSIS — J069 Acute upper respiratory infection, unspecified: Secondary | ICD-10-CM | POA: Diagnosis not present

## 2021-04-10 DIAGNOSIS — E039 Hypothyroidism, unspecified: Secondary | ICD-10-CM | POA: Diagnosis not present

## 2021-04-10 DIAGNOSIS — B372 Candidiasis of skin and nail: Secondary | ICD-10-CM | POA: Diagnosis not present

## 2021-04-10 DIAGNOSIS — E785 Hyperlipidemia, unspecified: Secondary | ICD-10-CM | POA: Diagnosis not present

## 2021-04-10 DIAGNOSIS — I10 Essential (primary) hypertension: Secondary | ICD-10-CM | POA: Diagnosis not present

## 2021-04-10 DIAGNOSIS — E559 Vitamin D deficiency, unspecified: Secondary | ICD-10-CM | POA: Diagnosis not present

## 2021-04-10 DIAGNOSIS — M25351 Other instability, right hip: Secondary | ICD-10-CM | POA: Diagnosis not present

## 2021-04-10 DIAGNOSIS — G43909 Migraine, unspecified, not intractable, without status migrainosus: Secondary | ICD-10-CM | POA: Diagnosis not present

## 2021-04-20 DIAGNOSIS — M7061 Trochanteric bursitis, right hip: Secondary | ICD-10-CM | POA: Diagnosis not present

## 2021-04-20 DIAGNOSIS — M7062 Trochanteric bursitis, left hip: Secondary | ICD-10-CM | POA: Diagnosis not present

## 2021-04-22 DIAGNOSIS — M7062 Trochanteric bursitis, left hip: Secondary | ICD-10-CM | POA: Diagnosis not present

## 2021-04-22 DIAGNOSIS — M7061 Trochanteric bursitis, right hip: Secondary | ICD-10-CM | POA: Diagnosis not present

## 2021-05-18 DIAGNOSIS — M7061 Trochanteric bursitis, right hip: Secondary | ICD-10-CM | POA: Diagnosis not present

## 2021-05-18 DIAGNOSIS — M7062 Trochanteric bursitis, left hip: Secondary | ICD-10-CM | POA: Diagnosis not present

## 2021-05-25 DIAGNOSIS — M533 Sacrococcygeal disorders, not elsewhere classified: Secondary | ICD-10-CM | POA: Diagnosis not present

## 2021-06-09 DIAGNOSIS — M533 Sacrococcygeal disorders, not elsewhere classified: Secondary | ICD-10-CM | POA: Diagnosis not present

## 2021-06-12 DIAGNOSIS — Z23 Encounter for immunization: Secondary | ICD-10-CM | POA: Diagnosis not present

## 2021-06-17 DIAGNOSIS — G43719 Chronic migraine without aura, intractable, without status migrainosus: Secondary | ICD-10-CM | POA: Diagnosis not present

## 2021-07-05 ENCOUNTER — Encounter (HOSPITAL_BASED_OUTPATIENT_CLINIC_OR_DEPARTMENT_OTHER): Payer: Self-pay

## 2021-07-05 ENCOUNTER — Other Ambulatory Visit: Payer: Self-pay

## 2021-07-05 ENCOUNTER — Emergency Department (HOSPITAL_BASED_OUTPATIENT_CLINIC_OR_DEPARTMENT_OTHER): Payer: Medicare Other

## 2021-07-05 ENCOUNTER — Emergency Department (HOSPITAL_BASED_OUTPATIENT_CLINIC_OR_DEPARTMENT_OTHER)
Admission: EM | Admit: 2021-07-05 | Discharge: 2021-07-05 | Disposition: A | Discharge status: Home / Self Care | Payer: Medicare Other | Attending: Emergency Medicine | Admitting: Emergency Medicine

## 2021-07-05 DIAGNOSIS — E039 Hypothyroidism, unspecified: Secondary | ICD-10-CM | POA: Diagnosis not present

## 2021-07-05 DIAGNOSIS — Z79899 Other long term (current) drug therapy: Secondary | ICD-10-CM | POA: Diagnosis not present

## 2021-07-05 DIAGNOSIS — W1839XA Other fall on same level, initial encounter: Secondary | ICD-10-CM | POA: Diagnosis not present

## 2021-07-05 DIAGNOSIS — S32020A Wedge compression fracture of second lumbar vertebra, initial encounter for closed fracture: Secondary | ICD-10-CM | POA: Diagnosis not present

## 2021-07-05 DIAGNOSIS — Z87891 Personal history of nicotine dependence: Secondary | ICD-10-CM | POA: Insufficient documentation

## 2021-07-05 DIAGNOSIS — S3992XA Unspecified injury of lower back, initial encounter: Secondary | ICD-10-CM | POA: Diagnosis present

## 2021-07-05 DIAGNOSIS — M545 Low back pain, unspecified: Secondary | ICD-10-CM | POA: Diagnosis not present

## 2021-07-05 MED ORDER — OXYCODONE-ACETAMINOPHEN 5-325 MG PO TABS
1.0000 | ORAL_TABLET | Freq: Once | ORAL | Status: AC
  Administered 2021-07-05: 1 via ORAL
  Filled 2021-07-05: qty 1

## 2021-07-05 MED ORDER — ONDANSETRON 4 MG PO TBDP
4.0000 mg | ORAL_TABLET | Freq: Once | ORAL | Status: AC
  Administered 2021-07-05: 4 mg via ORAL
  Filled 2021-07-05: qty 1

## 2021-07-05 MED ORDER — OXYCODONE-ACETAMINOPHEN 5-325 MG PO TABS: 1.0000 | ORAL_TABLET | Freq: Four times a day (QID) | ORAL | 0 refills | Status: DC | PRN

## 2021-07-05 NOTE — ED Provider Notes (Signed)
[facility]ERGENC[facility] DEPT Provider Note   CSN: [id number] Arrival date & time: 07/05/21  1453     History Chief Complaint  Patient presents with   Back Pain    Nancy Anthony is a 70 y.o. female.  Patient states that about 6 days ago she had a ground-level fall.  She was about to sit when somebody accidentally pulled the chair away and she fell onto her buttock.  She is been having lower back pain since then.  She feels numbness sensation from time to time in her right leg and at times her right arm.  Otherwise denies any headache or neck pain denies loss of consciousness.  She was finally able to return home yesterday and presents to the ER for further evaluation.  Denies any new weakness.  Denies any bowel or bladder dysfunction.  Denies any perineal numbness.  No reports of fevers or cough or vomiting or diarrhea.      Past Medical History:  Diagnosis Date   Hashimoto's disease    70 years old   Hyperlipidemia    Migraine    Pneumonia    Ruptured disk    Thyroid disease     Patient Active Problem List   Diagnosis Date Noted   Chronic migraine without aura, intractable, with status migrainosus 08/24/2017   Hypothyroidism 03/02/2012   Hx of migraine headaches 03/02/2012   Allergic rhinitis 03/02/2012   Dyslipidemia 03/02/2012    Past Surgical History:  Procedure Laterality Date   BREAST BIOPSY     BREAST EXCISIONAL BIOPSY Right 25+ yrs ago   benign   BREAST EXCISIONAL BIOPSY Left 25+ yrs ago   benign   CHOLECYSTECTOMY N/A 11/15/2017   Procedure: LAPAROSCOPIC CHOLECYSTECTOMY WITH INTRAOPERATIVE CHOLANGIOGRAM ERAS PATHWAY;  Surgeon: Donnie Mesa, MD;  Location: [facility];  Service: General;  Laterality: N/A;   COLONOSCOPY     CYST REMOVAL L EAR Left 1997     OB History   No obstetric history on file.     Family History  Problem Relation Age of Onset   Hyperthyroidism Mother    Hypothyroidism Father    Migraines Father    Prostate cancer  Brother    Hypothyroidism Brother     Social History   Tobacco Use   Smoking status: Former    Types: Cigarettes    Quit date: 12/1988    Years since quitting: 32.6   Smokeless tobacco: Never  Vaping Use   Vaping Use: Never used  Substance Use Topics   Alcohol use: No    Comment: quit 1996   Drug use: No    Home Medications Prior to Admission medications   Medication Sig Start Date End Date Taking? Authorizing Provider  alendronate (FOSAMAX) 70 MG tablet Take 70 mg by mouth once a week. 05/20/19   [provider]  aspirin 81 MG tablet Take 81 mg by mouth at bedtime.     [provider]  Calcium Citrate-Vitamin D (CALCIUM + D PO) Take 1 tablet by mouth 2 (two) times daily.    [provider]  Capsaicin-Menthol (SALONPAS GEL-PATCH HOT EX) Apply 1 patch topically as needed (back pain).    [provider]  cetirizine (ZYRTEC) 10 MG tablet Take 5 mg by mouth daily.    [provider]  frovatriptan (FROVA) 2.5 MG tablet Take 2.5 mg by mouth as needed for migraine. Weekly PRN    [provider]  HYDROcodone-acetaminophen (NORCO/VICODIN) 5-325 MG tablet Take 1-2 tablets by  mouth every 6 (six) hours as needed for moderate pain. Patient not taking: Reported on 06/07/2019 10/29/17   Lajean Saver, MD  HYDROcodone-acetaminophen (NORCO/VICODIN) 5-325 MG tablet Take 1 tablet by mouth every 6 (six) hours as needed for moderate pain. Patient not taking: Reported on 06/07/2019 11/15/17   Donnie Mesa, MD  ibuprofen (ADVIL) 200 MG tablet Take 400 mg by mouth every 6 (six) hours as needed for fever, headache or moderate pain.    [provider]  Multiple Vitamin (MULTIVITAMIN WITH MINERALS) TABS tablet Take 1 tablet by mouth daily.    [provider]  OnabotulinumtoxinA (BOTOX IJ) Inject 1 Dose as directed every 3 (three) months. For migraines    [provider]  ondansetron (ZOFRAN ODT) 8 MG disintegrating tablet Take 1  tablet (8 mg total) by mouth every 8 (eight) hours as needed for nausea or vomiting. Patient not taking: Reported on 06/07/2019 10/29/17   Lajean Saver, MD  perphenazine (TRILAFON) 4 MG tablet Take 2 mg by mouth as needed for migraine. PRN every 2 hours 05/30/19   [provider]  PRESCRIPTION MEDICATION Inject 1 Dose as directed every 3 (three) months. Trigger point injection for migraines    [provider]  promethazine (PHENERGAN) 25 MG suppository Place 25-50 mg rectally as needed for nausea or vomiting.     [provider]  rosuvastatin (CRESTOR) 5 MG tablet Take 2.5 mg by mouth at bedtime.  10/12/17   [provider]  SYNTHROID 88 MCG tablet Take 88 mcg by mouth at bedtime.  10/28/17   [provider]    Allergies    Codeine, Other, Statins, Tape, and Sulfa antibiotics  Review of Systems   Review of Systems  Constitutional:  Negative for fever.  HENT:  Negative for ear pain.   Eyes:  Negative for pain.  Respiratory:  Negative for cough.   Cardiovascular:  Negative for chest pain.  Gastrointestinal:  Negative for abdominal pain.  Genitourinary:  Negative for flank pain.  Musculoskeletal:  Positive for back pain.  Skin:  Negative for rash.  Neurological:  Negative for headaches.   Physical Exam Updated Vital Signs BP (!) 169/87 (BP Location: Right Arm)   Pulse 70   Temp 97.9 F (36.6 C)   Resp 16   SpO2 100%   Physical Exam Constitutional:      General: She is not in acute distress.    Appearance: Normal appearance.  HENT:     Head: Normocephalic.     Nose: Nose normal.  Eyes:     Extraocular Movements: Extraocular movements intact.  Cardiovascular:     Rate and Rhythm: Normal rate.  Pulmonary:     Effort: Pulmonary effort is normal.  Musculoskeletal:        General: Normal range of motion.     Cervical back: Normal range of motion.     Comments: 5/5 strength bilateral upper and lower extremities.  No C or T-spine  midline step-offs or tenderness noted.  L spine 4 5 paraspinal tenderness noted but no midline step-offs or tenderness noted.  Neurological:     General: No focal deficit present.     Mental Status: She is alert and oriented to person, place, and time. Mental status is at baseline.     Cranial Nerves: No cranial nerve deficit.     Motor: No weakness.     Comments: Patient has of mild to moderately antalgic gait but is able to ambulate without assistance.  ED Results / Procedures / Treatments   Labs (all labs ordered are listed, but only abnormal results are displayed) Labs Reviewed - No data to display  EKG None  Radiology CT Lumbar Spine Wo Contrast  Result Date: 07/05/2021 CLINICAL DATA:  Low back pain radiating into the mid back EXAM: CT LUMBAR SPINE WITHOUT CONTRAST TECHNIQUE: Multidetector CT imaging of the lumbar spine was performed without intravenous contrast administration. Multiplanar CT image reconstructions were also generated. COMPARISON:  MRI lumbar spine 08/17/2020 FINDINGS: Segmentation: 5 lumbar type vertebrae. Alignment: Normal. Vertebrae: L2 vertebral body compression fracture with approximately 10% height loss. No other fracture. No discitis or osteomyelitis. Generalized osteopenia. No aggressive osseous lesion. Paraspinal and other soft tissues: Negative. Other: None Disc levels: Disc spaces: Degenerative disease with disc height loss at L5-S1. Degenerative disease with mild disc height loss at L2-3. T12-L1: No disc protrusion, foraminal stenosis or central canal stenosis. L1-L2: Mild broad-based disc bulge. Mild bilateral facet arthropathy. No foraminal or central canal stenosis. L2-L3: Mild broad-based disc bulge. No foraminal or central canal stenosis. L3-L4: Mild broad-based disc bulge. Mild bilateral facet arthropathy. No foraminal or central canal stenosis. L4-L5: Broad-based disc bulge. No foraminal or central canal stenosis. L5-S1: No disc protrusion, foraminal  stenosis or central canal stenosis. IMPRESSION: 1. L2 vertebral body compression fracture with approximately 10% height loss. Electronically Signed   By: Kathreen Devoid M.D.   On: 07/05/2021 17:46    Procedures Procedures   Medications Ordered in ED Medications  oxyCODONE-acetaminophen (PERCOCET/ROXICET) 5-325 MG per tablet 1 tablet (1 tablet Oral Given 07/05/21 1719)  ondansetron (ZOFRAN-ODT) disintegrating tablet 4 mg (4 mg Oral Given 07/05/21 1719)    ED Course  I have reviewed the triage vital signs and the nursing notes.  Pertinent labs & imaging results that were available during my care of the patient were reviewed by me and considered in my medical decision making (see chart for details).    MDM Rules/Calculators/A&P                           No focal neurodeficit seen.  Patient given pain medication here with improvement of symptoms.  CT of the lumbar spine shows an L2 vertebral body compression fracture with 10% height loss.  Patient does follow-up with orthopedic spine surgeon which I advised her to follow-up with within this week.  Advising immediate return if she has new weakness new numbness or any additional concerns to return immediately to the ER otherwise follow-up with her doctor within the week.  Final Clinical Impression(s) / ED Diagnoses Final diagnoses:  Closed compression fracture of L2 lumbar vertebra, initial encounter [facility])    Rx / [place] Orders ED Discharge Orders     None        Luna Fuse, MD 07/05/21 [phone]

## 2021-07-05 NOTE — Discharge Instructions (Addendum)
Call your primary care doctor or specialist as discussed in the next 2-3 days.   Return immediately back to the ER if:  Your symptoms worsen within the next 12-24 hours. You develop new symptoms such as new fevers, persistent vomiting, new pain, shortness of breath, or new weakness or numbness, or if you have any other concerns.  

## 2021-07-05 NOTE — ED Notes (Signed)
Pt NAD, a/ox4. Pt verbalizes understanding of all DC and f/u instructions. All questions answered. Pt walks with steady gait to lobby at DC.  ? ?

## 2021-07-05 NOTE — ED Triage Notes (Signed)
Pt presents with lower back pain radiating up into her mid back, also c/o Right leg feeling "heavy" and pain to her Right arm x6 days. Pt reports someone accidentally pulled a chair out from under her as she was trying to sit down

## 2021-07-05 NOTE — ED Notes (Signed)
Patient transported to CT 

## 2021-07-10 DIAGNOSIS — S32000A Wedge compression fracture of unspecified lumbar vertebra, initial encounter for closed fracture: Secondary | ICD-10-CM | POA: Diagnosis not present

## 2021-07-13 DIAGNOSIS — M545 Low back pain, unspecified: Secondary | ICD-10-CM | POA: Diagnosis not present

## 2021-07-14 ENCOUNTER — Other Ambulatory Visit: Payer: Self-pay | Admitting: Orthopedic Surgery

## 2021-07-14 DIAGNOSIS — M545 Low back pain, unspecified: Secondary | ICD-10-CM

## 2021-07-20 DIAGNOSIS — L821 Other seborrheic keratosis: Secondary | ICD-10-CM | POA: Diagnosis not present

## 2021-07-20 DIAGNOSIS — L820 Inflamed seborrheic keratosis: Secondary | ICD-10-CM | POA: Diagnosis not present

## 2021-07-20 DIAGNOSIS — D225 Melanocytic nevi of trunk: Secondary | ICD-10-CM | POA: Diagnosis not present

## 2021-07-23 DIAGNOSIS — N958 Other specified menopausal and perimenopausal disorders: Secondary | ICD-10-CM | POA: Diagnosis not present

## 2021-07-23 DIAGNOSIS — Z124 Encounter for screening for malignant neoplasm of cervix: Secondary | ICD-10-CM | POA: Diagnosis not present

## 2021-07-23 DIAGNOSIS — M816 Localized osteoporosis [Lequesne]: Secondary | ICD-10-CM | POA: Diagnosis not present

## 2021-07-23 DIAGNOSIS — Z6828 Body mass index (BMI) 28.0-28.9, adult: Secondary | ICD-10-CM | POA: Diagnosis not present

## 2021-07-23 DIAGNOSIS — Z8781 Personal history of (healed) traumatic fracture: Secondary | ICD-10-CM | POA: Diagnosis not present

## 2021-07-23 DIAGNOSIS — E039 Hypothyroidism, unspecified: Secondary | ICD-10-CM | POA: Diagnosis not present

## 2021-07-26 ENCOUNTER — Ambulatory Visit
Admission: RE | Admit: 2021-07-26 | Discharge: 2021-07-26 | Disposition: A | Discharge status: Home / Self Care | Payer: Medicare Other | Source: Ambulatory Visit | Attending: Orthopedic Surgery | Admitting: Orthopedic Surgery

## 2021-07-26 ENCOUNTER — Other Ambulatory Visit: Payer: Self-pay

## 2021-07-26 DIAGNOSIS — R200 Anesthesia of skin: Secondary | ICD-10-CM | POA: Diagnosis not present

## 2021-07-26 DIAGNOSIS — M545 Low back pain, unspecified: Secondary | ICD-10-CM

## 2021-08-01 ENCOUNTER — Other Ambulatory Visit: Payer: Medicare Other

## 2021-08-03 DIAGNOSIS — M545 Low back pain, unspecified: Secondary | ICD-10-CM | POA: Diagnosis not present

## 2021-08-06 ENCOUNTER — Other Ambulatory Visit: Payer: Self-pay | Admitting: Orthopedic Surgery

## 2021-08-07 DIAGNOSIS — R198 Other specified symptoms and signs involving the digestive system and abdomen: Secondary | ICD-10-CM | POA: Diagnosis not present

## 2021-08-07 DIAGNOSIS — Z86010 Personal history of colonic polyps: Secondary | ICD-10-CM | POA: Diagnosis not present

## 2021-08-07 DIAGNOSIS — G43909 Migraine, unspecified, not intractable, without status migrainosus: Secondary | ICD-10-CM | POA: Diagnosis not present

## 2021-08-10 DIAGNOSIS — M810 Age-related osteoporosis without current pathological fracture: Secondary | ICD-10-CM | POA: Diagnosis not present

## 2021-08-26 NOTE — Progress Notes (Addendum)
Surgical Instructions    Your procedure is scheduled on 09/03/21.  Report to [facility] Main Entrance "A" at 9:30 A.M., then check in with the Admitting office.  Call this number if you have problems the morning of surgery:  [phone]   If you have any questions prior to your surgery date call [phone]: Open Monday-Friday 8am-4pm    Remember:  Do not eat after midnight the night before your surgery  You may drink clear liquids until 9:30 the morning of your surgery.   Clear liquids allowed are: Water, Non-Citrus Juices (without pulp), Carbonated Beverages, Clear Tea, Black Coffee ONLY (NO MILK, CREAM OR POWDERED CREAMER of any kind), and Gatorade  Please complete your PRE-SURGERY ENSURE that was provided to you by 9:30 the morning of surgery.  Please, if able, drink it in one setting. DO NOT SIP.     Take these medicines the morning of surgery with A SIP OF WATER: methocarbamol (ROBAXIN)  IF NEEDED: acetaminophen (TYLENOL)  frovatriptan (FROVA)  promethazine (PHENERGAN) perphenazine (TRILAFON)   As of today, STOP taking any Aspirin (unless otherwise instructed by your surgeon) Aleve, Naproxen, Ibuprofen, Motrin, Advil, Goody's, BC's, all herbal medications, fish oil, and all vitamins.            Do not wear jewelry or makeup Do not wear lotions, powders, perfumes or deodorant. Do not shave 48 hours prior to surgery.   Do not bring valuables to the hospital. DO Not wear nail polish, gel polish, artificial nails, or any other type of covering on natural nails including finger and toenails. If patients have artificial nails, gel coating, etc. that need to be removed by a nail salon, please have this removed prior to surgery or surgery may need to be canceled/delayed if the surgeon/ anesthesia feels like the patient is unable to be adequately monitored.             [facility] is not responsible for any belongings or valuables.  Do NOT Smoke (Tobacco/Vaping)  24 hours  prior to your procedure  If you use a CPAP at night, you may bring your mask for your overnight stay.   Contacts, glasses, hearing aids, dentures or partials may not be worn into surgery, please bring cases for these belongings   For patients admitted to the hospital, discharge time will be determined by your treatment team.   Patients discharged the day of surgery will not be allowed to drive home, and someone needs to stay with them for 24 hours.  NO VISITORS WILL BE ALLOWED IN PRE-OP WHERE PATIENTS ARE PREPPED FOR SURGERY.  ONLY 1 SUPPORT PERSON MAY BE PRESENT IN THE WAITING ROOM WHILE YOU ARE IN SURGERY.  IF YOU ARE TO BE ADMITTED, ONCE YOU ARE IN YOUR ROOM YOU WILL BE ALLOWED TWO (2) VISITORS. 1 (ONE) VISITOR MAY STAY OVERNIGHT BUT MUST ARRIVE TO THE ROOM BY 8pm.  Minor children may have two parents present. Special consideration for safety and communication needs will be reviewed on a case by case basis.  Special instructions:    Oral Hygiene is also important to reduce your risk of infection.  Remember - BRUSH YOUR TEETH THE MORNING OF SURGERY WITH YOUR REGULAR TOOTHPASTE   [facility]- Preparing For Surgery  Before surgery, you can play an important role. Because skin is not sterile, your skin needs to be as free of germs as possible. You can reduce the number of germs on your skin by washing with CHG (chlorahexidine gluconate) Soap  before surgery.  CHG is an antiseptic cleaner which kills germs and bonds with the skin to continue killing germs even after washing.     Please do not use if you have an allergy to CHG or antibacterial soaps. If your skin becomes reddened/irritated stop using the CHG.  Do not shave (including legs and underarms) for at least 48 hours prior to first CHG shower. It is OK to shave your face.  Please follow these instructions carefully.     Shower the NIGHT BEFORE SURGERY and the MORNING OF SURGERY with CHG Soap.   If you chose to wash your hair, wash  your hair first as usual with your normal shampoo. After you shampoo, rinse your hair and body thoroughly to remove the shampoo.  Then [employer] and genitals (private parts) with your normal soap and rinse thoroughly to remove soap.  After that Use CHG Soap as you would any other liquid soap. You can apply CHG directly to the skin and wash gently with a scrungie or a clean washcloth.   Apply the CHG Soap to your body ONLY FROM THE NECK DOWN.  Do not use on open wounds or open sores. Avoid contact with your eyes, ears, mouth and genitals (private parts). Wash Face and genitals (private parts)  with your normal soap.   Wash thoroughly, paying special attention to the area where your surgery will be performed.  Thoroughly rinse your body with warm water from the neck down.  DO NOT shower/wash with your normal soap after using and rinsing off the CHG Soap.  Pat yourself dry with a CLEAN TOWEL.  Wear CLEAN PAJAMAS to bed the night before surgery  Place CLEAN SHEETS on your bed the night before your surgery  DO NOT SLEEP WITH PETS.   Day of Surgery:  Take a shower with CHG soap. Wear Clean/Comfortable clothing the morning of surgery Do not apply any deodorants/lotions.   Remember to brush your teeth WITH YOUR REGULAR TOOTHPASTE.   Please read over the following fact sheets that you were given.

## 2021-08-27 ENCOUNTER — Encounter (HOSPITAL_COMMUNITY)
Admission: RE | Admit: 2021-08-27 | Discharge: 2021-08-27 | Disposition: A | Discharge status: Home / Self Care | Payer: Medicare Other | Source: Ambulatory Visit | Attending: Orthopedic Surgery | Admitting: Orthopedic Surgery

## 2021-08-27 ENCOUNTER — Encounter (HOSPITAL_COMMUNITY): Payer: Self-pay

## 2021-08-27 ENCOUNTER — Other Ambulatory Visit: Payer: Self-pay

## 2021-08-27 VITALS — BP 151/83 | HR 81 | Temp 98.4°F | Resp 17 | Ht 63.0 in | Wt 155.3 lb

## 2021-08-27 DIAGNOSIS — M4850XA Collapsed vertebra, not elsewhere classified, site unspecified, initial encounter for fracture: Secondary | ICD-10-CM | POA: Diagnosis not present

## 2021-08-27 DIAGNOSIS — Z01818 Encounter for other preprocedural examination: Secondary | ICD-10-CM

## 2021-08-27 DIAGNOSIS — M549 Dorsalgia, unspecified: Secondary | ICD-10-CM | POA: Insufficient documentation

## 2021-08-27 DIAGNOSIS — Z01812 Encounter for preprocedural laboratory examination: Secondary | ICD-10-CM | POA: Insufficient documentation

## 2021-08-27 HISTORY — DX: Anemia, unspecified: D64.9

## 2021-08-27 HISTORY — DX: Hypothyroidism, unspecified: E03.9

## 2021-08-27 LAB — CBC WITH DIFFERENTIAL/PLATELET
Abs Immature Granulocytes: 0.01 10*3/uL (ref 0.00–0.07)
Basophils Absolute: 0.1 10*3/uL (ref 0.0–0.1)
Basophils Relative: 1 %
Eosinophils Absolute: 0.2 10*3/uL (ref 0.0–0.5)
Eosinophils Relative: 5 %
HCT: 42.1 % (ref 36.0–46.0)
Hemoglobin: 13.8 g/dL (ref 12.0–15.0)
Immature Granulocytes: 0 %
Lymphocytes Relative: 27 %
Lymphs Abs: 1.2 10*3/uL (ref 0.7–4.0)
MCH: 29.4 pg (ref 26.0–34.0)
MCHC: 32.8 g/dL (ref 30.0–36.0)
MCV: 89.8 fL (ref 80.0–100.0)
Monocytes Absolute: 0.6 10*3/uL (ref 0.1–1.0)
Monocytes Relative: 14 %
Neutro Abs: 2.4 10*3/uL (ref 1.7–7.7)
Neutrophils Relative %: 53 %
Platelets: 245 10*3/uL (ref 150–400)
RBC: 4.69 MIL/uL (ref 3.87–5.11)
RDW: 13.1 % (ref 11.5–15.5)
WBC: 4.6 10*3/uL (ref 4.0–10.5)
nRBC: 0 % (ref 0.0–0.2)

## 2021-08-27 LAB — COMPREHENSIVE METABOLIC PANEL
ALT: 20 U/L (ref 0–44)
AST: 21 U/L (ref 15–41)
Albumin: 3.8 g/dL (ref 3.5–5.0)
Alkaline Phosphatase: 85 U/L (ref 38–126)
Anion gap: 7 (ref 5–15)
BUN: 12 mg/dL (ref 8–23)
CO2: 29 mmol/L (ref 22–32)
Calcium: 9.1 mg/dL (ref 8.9–10.3)
Chloride: 102 mmol/L (ref 98–111)
Creatinine, Ser: 0.84 mg/dL (ref 0.44–1.00)
GFR, Estimated: >60 mL/min (ref >60)
Glucose, Bld: 91 mg/dL (ref 70–99)
Potassium: 3.5 mmol/L (ref 3.5–5.1)
Sodium: 138 mmol/L (ref 135–145)
Total Bilirubin: 1.1 mg/dL (ref 0.3–1.2)
Total Protein: 6.4 g/dL — ABNORMAL LOW (ref 6.5–8.1)

## 2021-08-27 LAB — URINALYSIS, ROUTINE W REFLEX MICROSCOPIC
Bilirubin Urine: NEGATIVE
Glucose, UA: NEGATIVE mg/dL
Hgb urine dipstick: NEGATIVE
Ketones, ur: NEGATIVE mg/dL
Nitrite: NEGATIVE
Protein, ur: NEGATIVE mg/dL
Specific Gravity, Urine: 1.016 (ref 1.005–1.030)
pH: 7 (ref 5.0–8.0)

## 2021-08-27 LAB — TYPE AND SCREEN
ABO/RH(D): O POS
Antibody Screen: NEGATIVE

## 2021-08-27 LAB — PROTIME-INR
INR: 0.9 (ref 0.8–1.2)
Prothrombin Time: 12.2 seconds (ref 11.4–15.2)

## 2021-08-27 LAB — SURGICAL PCR SCREEN
MRSA, PCR: NEGATIVE
Staphylococcus aureus: POSITIVE — AB

## 2021-08-27 LAB — APTT: aPTT: 29 seconds (ref 24–36)

## 2021-08-27 NOTE — Progress Notes (Addendum)
Surgical Instructions    Your procedure is scheduled on Thursday, September 03, 2021 at 12:30 PM.  Report to [facility] Main Entrance "A" at 9:30 A.M., then check in with the Admitting office.  Call this number if you have problems the morning of surgery:  [phone]   If you have any questions prior to your surgery date call [phone]: Open Monday-Friday 8am-4pm    Remember:  Do not eat after midnight the night before your surgery  You may drink clear liquids until 9:30 the morning of your surgery.   Clear liquids allowed are: Water, Non-Citrus Juices (without pulp), Carbonated Beverages, Clear Tea, Black Coffee ONLY (NO MILK, CREAM OR POWDERED CREAMER of any kind), and Gatorade  Please complete your PRE-SURGERY ENSURE that was provided to you by 9:30 the morning of surgery.  Please, if able, drink it in one setting. DO NOT SIP.     Take these medicines the morning of surgery with A SIP OF WATER: methocarbamol (ROBAXIN)  IF NEEDED: acetaminophen (TYLENOL)  frovatriptan (FROVA)  promethazine (PHENERGAN) perphenazine (TRILAFON)   As of today, STOP taking any Aspirin (unless otherwise instructed by your surgeon) Aleve, Naproxen, Ibuprofen, Motrin, Advil, Goody's, BC's, all herbal medications, fish oil, and all vitamins.   Do NOT Smoke (Tobacco/Vaping)  24 hours prior to your procedure  If you use a CPAP at night, you may bring your mask for your overnight stay.   Contacts, glasses, hearing aids, dentures or partials may not be worn into surgery, please bring cases for these belongings   For patients admitted to the hospital, discharge time will be determined by your treatment team.   Patients discharged the day of surgery will not be allowed to drive home, and someone needs to stay with them for 24 hours.  NO VISITORS WILL BE ALLOWED IN PRE-OP WHERE PATIENTS ARE PREPPED FOR SURGERY.  ONLY 1 SUPPORT PERSON MAY BE PRESENT IN THE WAITING ROOM WHILE YOU ARE IN SURGERY.  IF YOU  ARE TO BE ADMITTED, ONCE YOU ARE IN YOUR ROOM YOU WILL BE ALLOWED TWO (2) VISITORS. 1 (ONE) VISITOR MAY STAY OVERNIGHT BUT MUST ARRIVE TO THE ROOM BY 8pm.  Minor children may have two parents present. Special consideration for safety and communication needs will be reviewed on a case by case basis.  Special instructions:    Oral Hygiene is also important to reduce your risk of infection.  Remember - BRUSH YOUR TEETH THE MORNING OF SURGERY WITH YOUR REGULAR TOOTHPASTE   [facility]- Preparing For Surgery  Before surgery, you can play an important role. Because skin is not sterile, your skin needs to be as free of germs as possible. You can reduce the number of germs on your skin by washing with CHG (chlorahexidine gluconate) Soap before surgery.  CHG is an antiseptic cleaner which kills germs and bonds with the skin to continue killing germs even after washing.     Please do not use if you have an allergy to CHG or antibacterial soaps. If your skin becomes reddened/irritated stop using the CHG.  Do not shave (including legs and underarms) for at least 48 hours prior to first CHG shower. It is OK to shave your face.  Please follow these instructions carefully.     Shower the NIGHT BEFORE SURGERY and the MORNING OF SURGERY with CHG Soap.   If you chose to wash your hair, wash your hair first as usual with your normal shampoo. After you shampoo, rinse your hair and  body thoroughly to remove the shampoo.  Then [employer] and genitals (private parts) with your normal soap and rinse thoroughly to remove soap.  After that Use CHG Soap as you would any other liquid soap. You can apply CHG directly to the skin and wash gently with a scrungie or a clean washcloth.   Apply the CHG Soap to your body ONLY FROM THE NECK DOWN.  Do not use on open wounds or open sores. Avoid contact with your eyes, ears, mouth and genitals (private parts). Wash Face and genitals (private parts)  with your normal soap.   Wash  thoroughly, paying special attention to the area where your surgery will be performed.  Thoroughly rinse your body with warm water from the neck down.  DO NOT shower/wash with your normal soap after using and rinsing off the CHG Soap.  Pat yourself dry with a CLEAN TOWEL.  Wear CLEAN PAJAMAS to bed the night before surgery  Place CLEAN SHEETS on your bed the night before your surgery  DO NOT SLEEP WITH PETS.   Day of Surgery:  Take a shower with CHG soap. Wear Clean/Comfortable clothing the morning of surgery Do not apply any deodorants/lotions.   Remember to brush your teeth WITH YOUR REGULAR TOOTHPASTE.  Do not wear jewelry or makeup Do not wear lotions, powders, perfumes or deodorant. Do not shave 48 hours prior to surgery.   Do not bring valuables to the hospital. DO Not wear nail polish, gel polish, artificial nails, or any other type of covering on natural nails including finger and toenails. If patients have artificial nails, gel coating, etc. that need to be removed by a nail salon, please have this removed prior to surgery or surgery may need to be canceled/delayed if the surgeon/ anesthesia feels like the patient is unable to be adequately monitored.             [place] [facility] is not responsible for any belongings or valuables.   Please read over the following fact sheets that you were given.

## 2021-08-27 NOTE — Progress Notes (Signed)
PCP - Dr. Harlan Stains Cardiologist - Denies  PPM/ICD - Denies  Chest x-ray - N/A EKG - 10/29/17 Stress Test - Denies ECHO - Denies Cardiac Cath - Denies  Sleep Study - Denies  Diabetic: Denies  Blood Thinner Instructions: N/A Aspirin Instructions: N/A  ERAS Protcol - Yes PRE-SURGERY Ensure - Patient declined pre- ensure drink due to intolerance/allergy to citric acid. Pre-Ensure and G2 both contain citric acid.  COVID TEST- N/A   Anesthesia review: No  Patient denies shortness of breath, fever, cough and chest pain at PAT appointment   All instructions explained to the patient, with a verbal understanding of the material. Patient agrees to go over the instructions while at home for a better understanding. Patient also instructed to self quarantine after being tested for COVID-19. The opportunity to ask questions was provided.

## 2021-09-01 DIAGNOSIS — M545 Low back pain, unspecified: Secondary | ICD-10-CM | POA: Diagnosis not present

## 2021-09-02 NOTE — Progress Notes (Signed)
Patient's phone number went straight to voicemail. Left message for patient and daughter with new arrival time of 77.

## 2021-09-02 NOTE — Anesthesia Preprocedure Evaluation (Addendum)
Anesthesia Evaluation  Patient identified by MRN, date of birth, ID band Patient awake    Reviewed: Allergy & Precautions, NPO status , Patient's Chart, lab work & pertinent test results  Airway Mallampati: III  TM Distance: >3 FB Neck ROM: Full    Dental  (+) Teeth Intact, Dental Advisory Given   Pulmonary former smoker,    Pulmonary exam normal breath sounds clear to auscultation       Cardiovascular negative cardio ROS Normal cardiovascular exam Rhythm:Regular Rate:Normal     Neuro/Psych  Headaches, LUMBAR 2 COMPRESSION FRACTURE negative psych ROS   GI/Hepatic negative GI ROS, Neg liver ROS,   Endo/Other  Hypothyroidism   Renal/GU negative Renal ROS     Musculoskeletal negative musculoskeletal ROS (+)   Abdominal   Peds  Hematology negative hematology ROS (+)   Anesthesia Other Findings   Reproductive/Obstetrics                            Anesthesia Physical Anesthesia Plan  ASA: 2  Anesthesia Plan: General   Post-op Pain Management: Tylenol PO (pre-op)   Induction: Intravenous  PONV Risk Score and Plan: 3 and Midazolam, Dexamethasone and Ondansetron  Airway Management Planned: Oral ETT  Additional Equipment:   Intra-op Plan:   Post-operative Plan: Extubation in OR  Informed Consent: I have reviewed the patients History and Physical, chart, labs and discussed the procedure including the risks, benefits and alternatives for the proposed anesthesia with the patient or authorized representative who has indicated his/her understanding and acceptance.     Dental advisory given  Plan Discussed with: CRNA  Anesthesia Plan Comments:        Anesthesia Quick Evaluation

## 2021-09-03 ENCOUNTER — Ambulatory Visit (HOSPITAL_COMMUNITY): Payer: Medicare Other | Admitting: Anesthesiology

## 2021-09-03 ENCOUNTER — Ambulatory Visit (HOSPITAL_COMMUNITY): Payer: Medicare Other

## 2021-09-03 ENCOUNTER — Encounter: Payer: Self-pay | Admitting: Surgical

## 2021-09-03 ENCOUNTER — Ambulatory Visit (HOSPITAL_COMMUNITY)
Admission: RE | Admit: 2021-09-03 | Discharge: 2021-09-03 | Disposition: A | Discharge status: Home / Self Care | Payer: Medicare Other | Attending: Orthopedic Surgery | Admitting: Orthopedic Surgery

## 2021-09-03 ENCOUNTER — Ambulatory Visit (HOSPITAL_COMMUNITY): Payer: Medicare Other | Admitting: Vascular Surgery

## 2021-09-03 ENCOUNTER — Encounter (HOSPITAL_COMMUNITY)
Admission: RE | Disposition: A | Discharge status: Home / Self Care | Payer: Self-pay | Source: Home / Self Care | Attending: Orthopedic Surgery

## 2021-09-03 ENCOUNTER — Encounter (HOSPITAL_COMMUNITY): Payer: Self-pay | Admitting: Orthopedic Surgery

## 2021-09-03 DIAGNOSIS — M8008XA Age-related osteoporosis with current pathological fracture, vertebra(e), initial encounter for fracture: Secondary | ICD-10-CM | POA: Insufficient documentation

## 2021-09-03 DIAGNOSIS — M4856XA Collapsed vertebra, not elsewhere classified, lumbar region, initial encounter for fracture: Secondary | ICD-10-CM | POA: Diagnosis not present

## 2021-09-03 DIAGNOSIS — Z87891 Personal history of nicotine dependence: Secondary | ICD-10-CM | POA: Diagnosis not present

## 2021-09-03 DIAGNOSIS — S32000D Wedge compression fracture of unspecified lumbar vertebra, subsequent encounter for fracture with routine healing: Secondary | ICD-10-CM | POA: Diagnosis not present

## 2021-09-03 DIAGNOSIS — Z419 Encounter for procedure for purposes other than remedying health state, unspecified: Secondary | ICD-10-CM

## 2021-09-03 DIAGNOSIS — M8088XA Other osteoporosis with current pathological fracture, vertebra(e), initial encounter for fracture: Secondary | ICD-10-CM | POA: Diagnosis not present

## 2021-09-03 HISTORY — PX: KYPHOPLASTY: SHX5884

## 2021-09-03 LAB — ABO/RH: ABO/RH(D): O POS

## 2021-09-03 SURGERY — KYPHOPLASTY
Anesthesia: General

## 2021-09-03 MED ORDER — ONDANSETRON HCL 4 MG/2ML IJ SOLN: 4.0000 mg | Freq: Once | INTRAMUSCULAR | Status: DC | PRN

## 2021-09-03 MED ORDER — LIDOCAINE 2% (20 MG/ML) 5 ML SYRINGE
INTRAMUSCULAR | Status: DC | PRN
  Administered 2021-09-03: 80 mg via INTRAVENOUS

## 2021-09-03 MED ORDER — PHENYLEPHRINE 40 MCG/ML (10ML) SYRINGE FOR IV PUSH (FOR BLOOD PRESSURE SUPPORT)
PREFILLED_SYRINGE | INTRAVENOUS | Status: AC
  Filled 2021-09-03: qty 20

## 2021-09-03 MED ORDER — MIDAZOLAM HCL 2 MG/2ML IJ SOLN
INTRAMUSCULAR | Status: AC
  Filled 2021-09-03: qty 2

## 2021-09-03 MED ORDER — LACTATED RINGERS IV SOLN: INTRAVENOUS | Status: DC | PRN

## 2021-09-03 MED ORDER — CHLORHEXIDINE GLUCONATE 0.12 % MT SOLN
OROMUCOSAL | Status: AC
  Administered 2021-09-03: 06:00:00 15 mL
  Filled 2021-09-03: qty 15

## 2021-09-03 MED ORDER — BACITRACIN ZINC 500 UNIT/GM EX OINT
TOPICAL_OINTMENT | CUTANEOUS | Status: AC
  Filled 2021-09-03: qty 28.35

## 2021-09-03 MED ORDER — STERILE WATER FOR IRRIGATION IR SOLN
Status: DC | PRN
  Administered 2021-09-03: 1000 mL

## 2021-09-03 MED ORDER — ROCURONIUM BROMIDE 10 MG/ML (PF) SYRINGE
PREFILLED_SYRINGE | INTRAVENOUS | Status: DC | PRN
  Administered 2021-09-03: 50 mg via INTRAVENOUS

## 2021-09-03 MED ORDER — IOPAMIDOL (ISOVUE-300) INJECTION 61%
INTRAVENOUS | Status: DC | PRN
  Administered 2021-09-03: 07:00:00 100 mL

## 2021-09-03 MED ORDER — ROCURONIUM BROMIDE 10 MG/ML (PF) SYRINGE
PREFILLED_SYRINGE | INTRAVENOUS | Status: AC
  Filled 2021-09-03: qty 20

## 2021-09-03 MED ORDER — DEXAMETHASONE SODIUM PHOSPHATE 10 MG/ML IJ SOLN
INTRAMUSCULAR | Status: AC
  Filled 2021-09-03: qty 1

## 2021-09-03 MED ORDER — ACETAMINOPHEN 500 MG PO TABS: 1000.0000 mg | ORAL_TABLET | Freq: Once | ORAL | Status: AC

## 2021-09-03 MED ORDER — FENTANYL CITRATE (PF) 250 MCG/5ML IJ SOLN
INTRAMUSCULAR | Status: DC | PRN
  Administered 2021-09-03: 100 ug via INTRAVENOUS
  Administered 2021-09-03: 50 ug via INTRAVENOUS

## 2021-09-03 MED ORDER — SUGAMMADEX SODIUM 200 MG/2ML IV SOLN
INTRAVENOUS | Status: DC | PRN
  Administered 2021-09-03: 140.6 mg via INTRAVENOUS

## 2021-09-03 MED ORDER — FENTANYL CITRATE (PF) 250 MCG/5ML IJ SOLN
INTRAMUSCULAR | Status: AC
  Filled 2021-09-03: qty 5

## 2021-09-03 MED ORDER — METHOCARBAMOL 500 MG PO TABS: 500.0000 mg | ORAL_TABLET | Freq: Every morning | ORAL | 2 refills | Status: DC

## 2021-09-03 MED ORDER — BUPIVACAINE-EPINEPHRINE (PF) 0.25% -1:200000 IJ SOLN
INTRAMUSCULAR | Status: AC
  Filled 2021-09-03: qty 30

## 2021-09-03 MED ORDER — ACETAMINOPHEN 500 MG PO TABS
ORAL_TABLET | ORAL | Status: AC
  Administered 2021-09-03: 06:00:00 1000 mg via ORAL
  Filled 2021-09-03: qty 2

## 2021-09-03 MED ORDER — LIDOCAINE 2% (20 MG/ML) 5 ML SYRINGE
INTRAMUSCULAR | Status: AC
  Filled 2021-09-03: qty 10

## 2021-09-03 MED ORDER — PHENYLEPHRINE 40 MCG/ML (10ML) SYRINGE FOR IV PUSH (FOR BLOOD PRESSURE SUPPORT)
PREFILLED_SYRINGE | INTRAVENOUS | Status: DC | PRN
  Administered 2021-09-03: 120 ug via INTRAVENOUS
  Administered 2021-09-03 (×3): 80 ug via INTRAVENOUS
  Administered 2021-09-03: 40 ug via INTRAVENOUS

## 2021-09-03 MED ORDER — EPHEDRINE 5 MG/ML INJ
INTRAVENOUS | Status: AC
  Filled 2021-09-03: qty 5

## 2021-09-03 MED ORDER — PROPOFOL 10 MG/ML IV BOLUS
INTRAVENOUS | Status: DC | PRN
  Administered 2021-09-03: 130 mg via INTRAVENOUS

## 2021-09-03 MED ORDER — MIDAZOLAM HCL 2 MG/2ML IJ SOLN
INTRAMUSCULAR | Status: DC | PRN
  Administered 2021-09-03: 2 mg via INTRAVENOUS

## 2021-09-03 MED ORDER — ONDANSETRON HCL 4 MG/2ML IJ SOLN
INTRAMUSCULAR | Status: AC
  Filled 2021-09-03: qty 4

## 2021-09-03 MED ORDER — DEXAMETHASONE SODIUM PHOSPHATE 10 MG/ML IJ SOLN
INTRAMUSCULAR | Status: DC | PRN
  Administered 2021-09-03: 10 mg via INTRAVENOUS

## 2021-09-03 MED ORDER — OXYCODONE-ACETAMINOPHEN 5-325 MG PO TABS: 1.0000 | ORAL_TABLET | ORAL | 0 refills | Status: DC | PRN

## 2021-09-03 MED ORDER — CEFAZOLIN SODIUM-DEXTROSE 2-4 GM/100ML-% IV SOLN
INTRAVENOUS | Status: AC
  Filled 2021-09-03: qty 100

## 2021-09-03 MED ORDER — FENTANYL CITRATE (PF) 100 MCG/2ML IJ SOLN
25.0000 ug | INTRAMUSCULAR | Status: DC | PRN
  Administered 2021-09-03 (×2): 50 ug via INTRAVENOUS

## 2021-09-03 MED ORDER — ONDANSETRON HCL 4 MG/2ML IJ SOLN
INTRAMUSCULAR | Status: DC | PRN
  Administered 2021-09-03: 4 mg via INTRAVENOUS

## 2021-09-03 MED ORDER — FENTANYL CITRATE (PF) 100 MCG/2ML IJ SOLN
INTRAMUSCULAR | Status: AC
  Filled 2021-09-03: qty 2

## 2021-09-03 MED ORDER — POVIDONE-IODINE 7.5 % EX SOLN
Freq: Once | CUTANEOUS | Status: DC
  Filled 2021-09-03: qty 118

## 2021-09-03 MED ORDER — CEFAZOLIN SODIUM-DEXTROSE 2-4 GM/100ML-% IV SOLN
2.0000 g | INTRAVENOUS | Status: AC
  Administered 2021-09-03: 08:00:00 2 g via INTRAVENOUS

## 2021-09-03 SURGICAL SUPPLY — 48 items
BAG COUNTER SPONGE SURGICOUNT (BAG) ×2 IMPLANT
BAG SURGICOUNT SPONGE COUNTING (BAG) ×1
BLADE SURG 15 STRL LF DISP TIS (BLADE) ×1 IMPLANT
BLADE SURG 15 STRL SS (BLADE) ×3
CEMENT KYPHON C01A KIT/MIXER (Cement) ×2 IMPLANT
COVER MAYO STAND STRL (DRAPES) ×3 IMPLANT
COVER SURGICAL LIGHT HANDLE (MISCELLANEOUS) ×3 IMPLANT
CURETTE EXPRESS SZ2 7MM (INSTRUMENTS) IMPLANT
CURRETTE EXPRESS SZ2 7MM (INSTRUMENTS)
DRAPE C-ARM 42X72 X-RAY (DRAPES) ×3 IMPLANT
DRAPE HALF SHEET 40X57 (DRAPES) IMPLANT
DRAPE INCISE IOBAN 66X45 STRL (DRAPES) ×3 IMPLANT
DRAPE LAPAROTOMY T 102X78X121 (DRAPES) ×3 IMPLANT
DRAPE SURG 17X23 STRL (DRAPES) ×12 IMPLANT
DRAPE WARM FLUID 44X44 (DRAPES) ×3 IMPLANT
DURAPREP 26ML APPLICATOR (WOUND CARE) ×3 IMPLANT
GAUZE 4X4 16PLY ~~LOC~~+RFID DBL (SPONGE) ×3 IMPLANT
GAUZE SPONGE 2X2 8PLY STRL LF (GAUZE/BANDAGES/DRESSINGS) ×1 IMPLANT
GAUZE SPONGE 4X4 12PLY STRL LF (GAUZE/BANDAGES/DRESSINGS) ×2 IMPLANT
GLOVE SRG 8 PF TXTR STRL LF DI (GLOVE) ×1 IMPLANT
GLOVE SURG ENC MOIS LTX SZ6.5 (GLOVE) ×3 IMPLANT
GLOVE SURG ENC MOIS LTX SZ8 (GLOVE) ×3 IMPLANT
GLOVE SURG UNDER POLY LF SZ7 (GLOVE) ×3 IMPLANT
GLOVE SURG UNDER POLY LF SZ8 (GLOVE) ×3
GOWN STRL REUS W/ TWL LRG LVL3 (GOWN DISPOSABLE) ×2 IMPLANT
GOWN STRL REUS W/ TWL XL LVL3 (GOWN DISPOSABLE) ×1 IMPLANT
GOWN STRL REUS W/TWL LRG LVL3 (GOWN DISPOSABLE) ×6
GOWN STRL REUS W/TWL XL LVL3 (GOWN DISPOSABLE) ×3
KIT BASIN OR (CUSTOM PROCEDURE TRAY) ×3 IMPLANT
KIT TURNOVER KIT B (KITS) ×3 IMPLANT
NDL HYPO 25X1 1.5 SAFETY (NEEDLE) ×1 IMPLANT
NDL SPNL 18GX3.5 QUINCKE PK (NEEDLE) ×2 IMPLANT
NEEDLE 22X1 1/2 (OR ONLY) (NEEDLE) IMPLANT
NEEDLE HYPO 25X1 1.5 SAFETY (NEEDLE) ×3 IMPLANT
NEEDLE SPNL 18GX3.5 QUINCKE PK (NEEDLE) ×6 IMPLANT
NS IRRIG 1000ML POUR BTL (IV SOLUTION) ×3 IMPLANT
PACK UNIVERSAL I (CUSTOM PROCEDURE TRAY) ×3 IMPLANT
PAD ARMBOARD 7.5X6 YLW CONV (MISCELLANEOUS) ×6 IMPLANT
POSITIONER HEAD PRONE TRACH (MISCELLANEOUS) ×3 IMPLANT
SPONGE GAUZE 2X2 STER 10/PKG (GAUZE/BANDAGES/DRESSINGS) ×2
SUT MNCRL AB 4-0 PS2 18 (SUTURE) ×3 IMPLANT
SYR BULB IRRIG 60ML STRL (SYRINGE) ×3 IMPLANT
SYR CONTROL 10ML LL (SYRINGE) ×3 IMPLANT
TAPE CLOTH SURG 4X10 WHT LF (GAUZE/BANDAGES/DRESSINGS) ×2 IMPLANT
TOWEL GREEN STERILE (TOWEL DISPOSABLE) ×3 IMPLANT
TOWEL GREEN STERILE FF (TOWEL DISPOSABLE) ×3 IMPLANT
TRAY KYPHOPAK 15/3 ONESTEP 1ST (MISCELLANEOUS) IMPLANT
TRAY KYPHOPAK 20/3 ONESTEP 1ST (MISCELLANEOUS) ×2 IMPLANT

## 2021-09-03 NOTE — Op Note (Addendum)
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  SURGEON:  Phylliss Bob, MD.  ASSISTANT:  None  ANESTHESIA:  General endotracheal anesthesia.  COMPLICATIONS:  None.  DISPOSITION:  Stable.  ESTIMATED BLOOD LOSS:  Minimal.  INDICATIONS FOR SURGERY:  Briefly, [name] is a very pleasant 70- year-old female, who did have an onset of pain in her low back in October.   Her pain was rather severe.  The patient's imaging studies did clearly reveal a L2 compression fracture. We did attempt a trial of nonoperative treatment, but the patient continued to feel rather debilitated as a result of her ongoing pain.  Given her ongoing pain and dysfunction, we did discuss proceeding with the procedure noted above.  I did fully discuss the procedure with the patient, and she did wish to proceed.  OPERATIVE DETAILS:  On 09/03/2021, the patient was brought to surgery and general endotracheal anesthesia was administered.  The patient was placed prone on a well-padded flat Jackson bed with gel rolls placed under the patient's chest and hips.  Antibiotics were given.  AP and lateral fluoroscopy was brought into the field.  The L2 pedicles were marked out in the usual fashion.  After a time-out procedure was performed, I did advance Jamshidis across the L2 pedicles on the right and left sides.  I then drilled through the Jamshidis.  I then inserted kyphoplasty balloons and I was able to inflate the balloons with approximately 4cc of contrast.  Partial restoration of the superior endplate was noted.  The balloons were then removed.  At this point, after the cement was mixed, a total of approximately 6cc of  cement was injected through the right and left cannulas, half on the right, and half on the left.  Excellent interdigitation of cement was identified.  During the last cc of introduction of cement through the right cannula, a small amount of cement was noted in a small vessel on the right, just beside the vertebral body, which did migrate anteriorly as well.  This was noted to be a very small amount of cement, and upon visualization of this, I immediately discontinued introduction of additional cement through the right cannula.  No additional extravasation was noted.  Of note, there was no extravasation posteriorly in the region of any neurologic structures. The cement was then allowed to harden, after which point the Jamshidis were removed.  The wound  was then irrigated and closed using 4-0 Monocryl.  Bacitracin and a sterile dressing were applied.  The patient was then awoken from general endotracheal anesthesia and transferred to recovery in stable condition.    Phylliss Bob, MD

## 2021-09-03 NOTE — Transfer of Care (Signed)
Immediate Anesthesia Transfer of Care Note  Patient: Jahnaya Branscome  Procedure(s) Performed: LUMBAR 2 KYPHOPLASTY  Patient Location: PACU  Anesthesia Type:General  Level of Consciousness: awake, alert  and oriented  Airway & Oxygen Therapy: Patient Spontanous Breathing and Patient connected to face mask oxygen  Post-op Assessment: Report given to RN and Post -op Vital signs reviewed and stable  Post vital signs: Reviewed and stable  Last Vitals:  Vitals Value Taken Time  BP 169/83 09/03/21 0901  Temp    Pulse 91 09/03/21 0904  Resp 19 09/03/21 0904  SpO2 93 % 09/03/21 0904  Vitals shown include unvalidated device data.  Last Pain:  Vitals:   09/03/21 0602  TempSrc:   PainSc: 0-No pain      Patients Stated Pain Goal: 0 (31/42/76 [id number])  Complications: No notable events documented.

## 2021-09-03 NOTE — Anesthesia Postprocedure Evaluation (Signed)
Anesthesia Post Note  Patient: Nancy Anthony  Procedure(s) Performed: LUMBAR 2 KYPHOPLASTY     Patient location during evaluation: PACU Anesthesia Type: General Level of consciousness: awake and alert Pain management: pain level controlled Vital Signs Assessment: post-procedure vital signs reviewed and stable Respiratory status: spontaneous breathing, nonlabored ventilation and respiratory function stable Cardiovascular status: blood pressure returned to baseline and stable Postop Assessment: no apparent nausea or vomiting Anesthetic complications: no   No notable events documented.  Last Vitals:  Vitals:   09/03/21 0915 09/03/21 0930  BP: (!) 147/71 (!) 153/74  Pulse: 78 73  Resp: 14 14  Temp:    SpO2: 100% 100%    Last Pain:  Vitals:   09/03/21 0930  TempSrc:   PainSc: 5     LLE Motor Response: Purposeful movement;Responds to commands (09/03/21 0930) LLE Sensation: Full sensation (09/03/21 0930) RLE Motor Response: Purposeful movement;Responds to commands (09/03/21 0930) RLE Sensation: Full sensation (09/03/21 0930)      Catalina Gravel

## 2021-09-03 NOTE — H&P (Signed)
PREOPERATIVE H&P  Chief Complaint: Low back pain  HPI: Nancy Anthony is a 70 y.o. female who presents with ongoing pain in the low back  MRI reveals an L2 compression fracture  Patient has failed multiple forms of conservative care and continues to have pain (see office notes for additional details regarding the patient's full course of treatment)  Past Medical History:  Diagnosis Date   Anemia    Hashimoto's disease    70 years old   Hyperlipidemia    Hypothyroidism    Migraine    Pneumonia    Ruptured disk    Thyroid disease    Past Surgical History:  Procedure Laterality Date   BREAST BIOPSY     BREAST EXCISIONAL BIOPSY Right 25+ yrs ago   benign   BREAST EXCISIONAL BIOPSY Left 25+ yrs ago   benign   CHOLECYSTECTOMY N/A 11/15/2017   Procedure: LAPAROSCOPIC CHOLECYSTECTOMY WITH INTRAOPERATIVE CHOLANGIOGRAM ERAS PATHWAY;  Surgeon: Donnie Mesa, MD;  Location: [facility];  Service: General;  Laterality: N/A;   COLONOSCOPY     CYST REMOVAL L EAR Left 1997   DIAGNOSTIC LAPAROSCOPY     Social History   Socioeconomic History   Marital status: Widowed    Spouse name: Not on file   Number of children: 1   Years of education: Not on file   Highest education level: Master's degree (e.g., MA, MS, MEng, MEd, MSW, MBA)  Occupational History   Not on file  Tobacco Use   Smoking status: Former    Types: Cigarettes    Quit date: 12/1988    Years since quitting: 32.7   Smokeless tobacco: Never  Vaping Use   Vaping Use: Never used  Substance and Sexual Activity   Alcohol use: No    Comment: quit 1996   Drug use: No   Sexual activity: Yes    Partners: Male  Other Topics Concern   Not on file  Social History Narrative   Regular exercise: no   Caffeine use: 1-2 cups coffee per day    Lives at home alone with her dog   Right handed   Social Determinants of Health   Financial Resource Strain: Not on file  Food Insecurity: Not on file  Transportation Needs:  Not on file  Physical Activity: Not on file  Stress: Not on file  Social Connections: Not on file   Family History  Problem Relation Age of Onset   Hyperthyroidism Mother    Hypothyroidism Father    Migraines Father    Prostate cancer Brother    Hypothyroidism Brother    Allergies  Allergen Reactions   Citric Acid     migraines   Codeine Nausea And Vomiting    Headache & shakes   Other Nausea And Vomiting    MSG - migraines   Statins     Muscle weakness, tolerates low doses of crestor   Tape Hives   Epinephrine Palpitations   Sulfa Antibiotics Nausea And Vomiting and Rash   Prior to Admission medications   Medication Sig Start Date End Date Taking? Authorizing Provider  acetaminophen (TYLENOL) 500 MG tablet Take 1,000 mg by mouth every 6 (six) hours as needed for moderate pain.   Yes [provider]  Calcium-Magnesium-Vitamin D (CALCIUM MAGNESIUM PO) Take 1 tablet by mouth daily.   Yes [provider]  Cholecalciferol (VITAMIN D) 125 MCG (5000 UT) CAPS Take 5,000 Units by mouth daily.   Yes [provider]  Folic Acid-Vit Y5-WLS L37 (FOLBEE) 2.5-25-1 MG TABS tablet Take 1 tablet by mouth daily.   Yes [provider]  frovatriptan (FROVA) 2.5 MG tablet Take 2.5 mg by mouth as needed for migraine.   Yes [provider]  ibuprofen (ADVIL) 200 MG tablet Take 600 mg by mouth every 6 (six) hours as needed for moderate pain.   Yes [provider]  methocarbamol (ROBAXIN) 500 MG tablet Take 500-1,000 mg by mouth in the morning. 07/13/21  Yes [provider]  NYSTATIN powder Apply 1 application topically 2 (two) times daily as needed. 04/19/21  Yes [provider]  oxyCODONE-acetaminophen (PERCOCET/ROXICET) 5-325 MG tablet Take 1 tablet by mouth every 6 (six) hours as needed for severe pain. 07/05/21  Yes Luna Fuse, MD  perphenazine (TRILAFON) 4 MG tablet Take 2 mg by mouth as needed for migraine. PRN every 2  hours 05/30/19  Yes [provider]  promethazine (PHENERGAN) 25 MG suppository Place 25-50 mg rectally as needed for nausea or vomiting.    Yes [provider]  rosuvastatin (CRESTOR) 5 MG tablet Take 2.5 mg by mouth at bedtime.  10/12/17  Yes [provider]  SYNTHROID 88 MCG tablet Take 88 mcg by mouth at bedtime.  10/28/17  Yes [provider]  ondansetron (ZOFRAN ODT) 8 MG disintegrating tablet Take 1 tablet (8 mg total) by mouth every 8 (eight) hours as needed for nausea or vomiting. Patient not taking: Reported on 06/07/2019 10/29/17   Lajean Saver, MD     All other systems have been reviewed and were otherwise negative with the exception of those mentioned in the HPI and as above.  Physical Exam: Vitals:   09/03/21 0550  BP: (!) 169/82  Pulse: 73  Resp: 17  Temp: 98.2 F (36.8 C)  SpO2: 99%    Body mass index is 27.46 kg/m.  General: Alert, no acute distress Cardiovascular: No pedal edema Respiratory: No cyanosis, no use of accessory musculature Skin: No lesions in the area of chief complaint Neurologic: Sensation intact distally Psychiatric: Patient is competent for consent with normal mood and affect Lymphatic: No axillary or cervical lymphadenopathy   Assessment/Plan: LUMBAR 2 COMPRESSION FRACTURE Plan for Procedure(s): LUMBAR 2 KYPHOPLASTY   Norva Karvonen, MD 09/03/2021 6:49 AM

## 2021-09-03 NOTE — Anesthesia Procedure Notes (Signed)
Procedure Name: Intubation Date/Time: 09/03/2021 7:55 AM Performed by: Minerva Ends, CRNA Pre-anesthesia Checklist: Patient identified, Emergency Drugs available, Suction available and Patient being monitored Patient Re-evaluated:Patient Re-evaluated prior to induction Oxygen Delivery Method: Circle system utilized Preoxygenation: Pre-oxygenation with 100% oxygen Induction Type: IV induction Ventilation: Mask ventilation without difficulty Laryngoscope Size: Mac and 3 Grade View: Grade II Tube type: Oral Tube size: 7.0 mm Number of attempts: 1 Placement Confirmation: ETT inserted through vocal cords under direct vision, positive ETCO2 and breath sounds checked- equal and bilateral Secured at: 22 cm Tube secured with: Tape Dental Injury: Teeth and Oropharynx as per pre-operative assessment

## 2021-09-11 ENCOUNTER — Other Ambulatory Visit: Payer: Self-pay

## 2021-09-11 ENCOUNTER — Inpatient Hospital Stay (HOSPITAL_BASED_OUTPATIENT_CLINIC_OR_DEPARTMENT_OTHER)
Admission: EM | Admit: 2021-09-11 | Discharge: 2021-09-15 | DRG: 907 | Disposition: A | Discharge status: Home / Self Care | Payer: Medicare Other | Attending: Student | Admitting: Student

## 2021-09-11 ENCOUNTER — Encounter (HOSPITAL_BASED_OUTPATIENT_CLINIC_OR_DEPARTMENT_OTHER): Payer: Self-pay | Admitting: Obstetrics and Gynecology

## 2021-09-11 ENCOUNTER — Emergency Department (HOSPITAL_BASED_OUTPATIENT_CLINIC_OR_DEPARTMENT_OTHER): Payer: Medicare Other

## 2021-09-11 DIAGNOSIS — Z79899 Other long term (current) drug therapy: Secondary | ICD-10-CM | POA: Diagnosis not present

## 2021-09-11 DIAGNOSIS — Z9889 Other specified postprocedural states: Secondary | ICD-10-CM

## 2021-09-11 DIAGNOSIS — I82220 Acute embolism and thrombosis of inferior vena cava: Secondary | ICD-10-CM | POA: Diagnosis not present

## 2021-09-11 DIAGNOSIS — I8290 Acute embolism and thrombosis of unspecified vein: Secondary | ICD-10-CM | POA: Diagnosis not present

## 2021-09-11 DIAGNOSIS — Z882 Allergy status to sulfonamides status: Secondary | ICD-10-CM

## 2021-09-11 DIAGNOSIS — R6510 Systemic inflammatory response syndrome (SIRS) of non-infectious origin without acute organ dysfunction: Secondary | ICD-10-CM | POA: Diagnosis present

## 2021-09-11 DIAGNOSIS — R1010 Upper abdominal pain, unspecified: Secondary | ICD-10-CM | POA: Diagnosis not present

## 2021-09-11 DIAGNOSIS — Z87891 Personal history of nicotine dependence: Secondary | ICD-10-CM

## 2021-09-11 DIAGNOSIS — T8189XA Other complications of procedures, not elsewhere classified, initial encounter: Principal | ICD-10-CM | POA: Diagnosis present

## 2021-09-11 DIAGNOSIS — Z888 Allergy status to other drugs, medicaments and biological substances status: Secondary | ICD-10-CM | POA: Diagnosis not present

## 2021-09-11 DIAGNOSIS — R109 Unspecified abdominal pain: Secondary | ICD-10-CM | POA: Diagnosis not present

## 2021-09-11 DIAGNOSIS — Z91018 Allergy to other foods: Secondary | ICD-10-CM | POA: Diagnosis not present

## 2021-09-11 DIAGNOSIS — E785 Hyperlipidemia, unspecified: Secondary | ICD-10-CM | POA: Diagnosis present

## 2021-09-11 DIAGNOSIS — Z885 Allergy status to narcotic agent status: Secondary | ICD-10-CM

## 2021-09-11 DIAGNOSIS — E860 Dehydration: Secondary | ICD-10-CM | POA: Diagnosis present

## 2021-09-11 DIAGNOSIS — E063 Autoimmune thyroiditis: Secondary | ICD-10-CM | POA: Diagnosis present

## 2021-09-11 DIAGNOSIS — Y838 Other surgical procedures as the cause of abnormal reaction of the patient, or of later complication, without mention of misadventure at the time of the procedure: Secondary | ICD-10-CM | POA: Diagnosis present

## 2021-09-11 DIAGNOSIS — G43909 Migraine, unspecified, not intractable, without status migrainosus: Secondary | ICD-10-CM | POA: Diagnosis not present

## 2021-09-11 DIAGNOSIS — E871 Hypo-osmolality and hyponatremia: Secondary | ICD-10-CM | POA: Diagnosis not present

## 2021-09-11 DIAGNOSIS — Z79890 Hormone replacement therapy (postmenopausal): Secondary | ICD-10-CM | POA: Diagnosis not present

## 2021-09-11 DIAGNOSIS — Z8669 Personal history of other diseases of the nervous system and sense organs: Secondary | ICD-10-CM

## 2021-09-11 DIAGNOSIS — D62 Acute posthemorrhagic anemia: Secondary | ICD-10-CM | POA: Diagnosis not present

## 2021-09-11 DIAGNOSIS — E878 Other disorders of electrolyte and fluid balance, not elsewhere classified: Secondary | ICD-10-CM | POA: Diagnosis not present

## 2021-09-11 DIAGNOSIS — E039 Hypothyroidism, unspecified: Secondary | ICD-10-CM | POA: Diagnosis not present

## 2021-09-11 DIAGNOSIS — Z91048 Other nonmedicinal substance allergy status: Secondary | ICD-10-CM

## 2021-09-11 DIAGNOSIS — Z20822 Contact with and (suspected) exposure to covid-19: Secondary | ICD-10-CM | POA: Diagnosis not present

## 2021-09-11 LAB — URINALYSIS, ROUTINE W REFLEX MICROSCOPIC
Bilirubin Urine: NEGATIVE
Glucose, UA: NEGATIVE mg/dL
Ketones, ur: 40 mg/dL — AB
Nitrite: NEGATIVE
Protein, ur: NEGATIVE mg/dL
Specific Gravity, Urine: 1.01 (ref 1.005–1.030)
Trans Epithel, UA: <1
pH: 6 (ref 5.0–8.0)

## 2021-09-11 LAB — COMPREHENSIVE METABOLIC PANEL
ALT: 8 U/L (ref 0–44)
AST: 12 U/L — ABNORMAL LOW (ref 15–41)
Albumin: 4.2 g/dL (ref 3.5–5.0)
Alkaline Phosphatase: 91 U/L (ref 38–126)
Anion gap: 12 (ref 5–15)
BUN: 13 mg/dL (ref 8–23)
CO2: 25 mmol/L (ref 22–32)
Calcium: 9.6 mg/dL (ref 8.9–10.3)
Chloride: 96 mmol/L — ABNORMAL LOW (ref 98–111)
Creatinine, Ser: 0.77 mg/dL (ref 0.44–1.00)
GFR, Estimated: >60 mL/min (ref >60)
Glucose, Bld: 104 mg/dL — ABNORMAL HIGH (ref 70–99)
Potassium: 4.5 mmol/L (ref 3.5–5.1)
Sodium: 133 mmol/L — ABNORMAL LOW (ref 135–145)
Total Bilirubin: 1.1 mg/dL (ref 0.3–1.2)
Total Protein: 7.4 g/dL (ref 6.5–8.1)

## 2021-09-11 LAB — CBC
HCT: 41.5 % (ref 36.0–46.0)
Hemoglobin: 13.8 g/dL (ref 12.0–15.0)
MCH: 29.4 pg (ref 26.0–34.0)
MCHC: 33.3 g/dL (ref 30.0–36.0)
MCV: 88.5 fL (ref 80.0–100.0)
Platelets: 266 10*3/uL (ref 150–400)
RBC: 4.69 MIL/uL (ref 3.87–5.11)
RDW: 13.1 % (ref 11.5–15.5)
WBC: 12.5 10*3/uL — ABNORMAL HIGH (ref 4.0–10.5)
nRBC: 0 % (ref 0.0–0.2)

## 2021-09-11 LAB — LIPASE, BLOOD: Lipase: 14 U/L (ref 11–51)

## 2021-09-11 LAB — LACTIC ACID, PLASMA: Lactic Acid, Venous: 1.3 mmol/L (ref 0.5–1.9)

## 2021-09-11 MED ORDER — SODIUM CHLORIDE 0.9 % IV BOLUS (SEPSIS)
1000.0000 mL | Freq: Once | INTRAVENOUS | Status: AC
  Administered 2021-09-11: 1000 mL via INTRAVENOUS

## 2021-09-11 MED ORDER — SODIUM CHLORIDE 0.9 % IV SOLN
1000.0000 mL | INTRAVENOUS | Status: DC
  Administered 2021-09-11: 1000 mL via INTRAVENOUS

## 2021-09-11 MED ORDER — IOHEXOL 300 MG/ML  SOLN
100.0000 mL | Freq: Once | INTRAMUSCULAR | Status: AC | PRN
  Administered 2021-09-11: 100 mL via INTRAVENOUS

## 2021-09-11 NOTE — ED Provider Notes (Signed)
[facility] Provider Note   CSN: [id number] Arrival date & time: 09/11/21  1824     History  Chief Complaint  Patient presents with   Abdominal Pain    Nancy Anthony is a 71 y.o. female.   Abdominal Pain Patient has history of hyperlipidemia thyroid disease, and ruptured disc with recent spinal surgery. Patient presented to the ED for evaluation of abdominal pain.  Patient states she has been having the symptoms now for the last 5 days.  Patient had lumbar kyphoplasty procedure on December 29.  She states she has been trying to avoid taking pain medications.  She has not felt constipated but when she started developing the pain and thought maybe that could be the issue.  She tried taking stool softeners.  Patient's pain persisted.  Primarily in the right upper abdomen in the middle of her upper abdomen.  She went to a walk-in clinic and they suggested she come to the ED for further evaluation.  Patient states she has had prior gallbladder surgery.  She has never had pain like this before  Home Medications Prior to Admission medications   Medication Sig Start Date End Date Taking? Authorizing Provider  acetaminophen (TYLENOL) 500 MG tablet Take 1,000 mg by mouth every 6 (six) hours as needed for moderate pain.    [provider]  Calcium-Magnesium-Vitamin D (CALCIUM MAGNESIUM PO) Take 1 tablet by mouth daily.    [provider]  Cholecalciferol (VITAMIN D) 125 MCG (5000 UT) CAPS Take 5,000 Units by mouth daily.    [provider]  Folic Acid-Vit H1-TAV W97 (FOLBEE) 2.5-25-1 MG TABS tablet Take 1 tablet by mouth daily.    [provider]  frovatriptan (FROVA) 2.5 MG tablet Take 2.5 mg by mouth as needed for migraine.    [provider]  methocarbamol (ROBAXIN) 500 MG tablet Take 1-2 tablets (500-1,000 mg total) by mouth in the morning. 09/03/21   Phylliss Bob, MD  NYSTATIN powder Apply 1 application topically 2  (two) times daily as needed. 04/19/21   [provider]  ondansetron (ZOFRAN ODT) 8 MG disintegrating tablet Take 1 tablet (8 mg total) by mouth every 8 (eight) hours as needed for nausea or vomiting. Patient not taking: Reported on 06/07/2019 10/29/17   Lajean Saver, MD  oxyCODONE-acetaminophen (PERCOCET/ROXICET) 5-325 MG tablet Take 1 tablet by mouth every 4 (four) hours as needed for severe pain. 09/03/21   Phylliss Bob, MD  perphenazine (TRILAFON) 4 MG tablet Take 2 mg by mouth as needed for migraine. PRN every 2 hours 05/30/19   [provider]  promethazine (PHENERGAN) 25 MG suppository Place 25-50 mg rectally as needed for nausea or vomiting.     [provider]  rosuvastatin (CRESTOR) 5 MG tablet Take 2.5 mg by mouth at bedtime.  10/12/17   [provider]  SYNTHROID 88 MCG tablet Take 88 mcg by mouth at bedtime.  10/28/17   [provider]      Allergies    Citric acid, Codeine, Other, Statins, Tape, Epinephrine, and Sulfa antibiotics    Review of Systems   Review of Systems  Gastrointestinal:  Positive for abdominal pain.  All other systems reviewed and are negative.  Physical Exam Updated Vital Signs BP (!) 157/74 (BP Location: Right Arm)    Pulse 95    Temp 99 F (37.2 C)    Resp 20    Ht 1.6 m (5\' 3" )    Wt 69.9 kg  SpO2 100%    BMI 27.28 kg/m  Physical Exam Vitals and nursing note reviewed.  Constitutional:      General: She is not in acute distress.    Appearance: She is well-developed.  HENT:     Head: Normocephalic and atraumatic.     Right Ear: External ear normal.     Left Ear: External ear normal.  Eyes:     General: No scleral icterus.       Right eye: No discharge.        Left eye: No discharge.     Conjunctiva/sclera: Conjunctivae normal.  Neck:     Trachea: No tracheal deviation.  Cardiovascular:     Rate and Rhythm: Normal rate and regular rhythm.  Pulmonary:     Effort: Pulmonary effort is normal. No  respiratory distress.     Breath sounds: Normal breath sounds. No stridor. No wheezing or rales.  Abdominal:     General: Bowel sounds are normal. There is no distension.     Palpations: Abdomen is soft.     Tenderness: There is abdominal tenderness in the right upper quadrant and epigastric area. There is guarding. There is no rebound.  Musculoskeletal:        General: No tenderness or deformity.     Cervical back: Neck supple.  Skin:    General: Skin is warm and dry.     Findings: No rash.  Neurological:     General: No focal deficit present.     Mental Status: She is alert.     Cranial Nerves: No cranial nerve deficit (no facial droop, extraocular movements intact, no slurred speech).     Sensory: No sensory deficit.     Motor: No abnormal muscle tone or seizure activity.     Coordination: Coordination normal.  Psychiatric:        Mood and Affect: Mood normal.    ED Results / Procedures / Treatments   Labs (all labs ordered are listed, but only abnormal results are displayed) Labs Reviewed  COMPREHENSIVE METABOLIC PANEL - Abnormal; Notable for the following components:      Result Value   Sodium 133 (*)    Chloride 96 (*)    Glucose, Bld 104 (*)    AST 12 (*)    All other components within normal limits  CBC - Abnormal; Notable for the following components:   WBC 12.5 (*)    All other components within normal limits  LIPASE, BLOOD  LACTIC ACID, PLASMA  LACTIC ACID, PLASMA    EKG None  Radiology No results found.  Procedures Procedures    Medications Ordered in ED Medications  sodium chloride 0.9 % bolus 1,000 mL (has no administration in time range)    Followed by  0.9 %  sodium chloride infusion (has no administration in time range)    ED Course/ Medical Decision Making/ A&P                           Medical Decision Making  Pt presented with persistent abd pain.  Labs with mild leukocytosis, no pancreatitis or hepatitis.  UA with questionable  infection.  With her persistent pain, recent surgery, exam concerning.   CT scan ordered.  Pt declined IV pain medications.  Care turned over to Dr Leonette Monarch pending CT scan results.        Final Clinical Impression(s) / ED Diagnoses Pending at shift change  Dorie Rank, MD 09/13/21 [phone]

## 2021-09-11 NOTE — ED Triage Notes (Signed)
Patient reports to the ER for abdominal pain x5 days. Patient sent by [facility] in clinic for further evaluation.

## 2021-09-12 ENCOUNTER — Encounter (HOSPITAL_COMMUNITY): Payer: Self-pay | Admitting: Internal Medicine

## 2021-09-12 DIAGNOSIS — Z79890 Hormone replacement therapy (postmenopausal): Secondary | ICD-10-CM | POA: Diagnosis not present

## 2021-09-12 DIAGNOSIS — Z91048 Other nonmedicinal substance allergy status: Secondary | ICD-10-CM | POA: Diagnosis not present

## 2021-09-12 DIAGNOSIS — E860 Dehydration: Secondary | ICD-10-CM | POA: Diagnosis present

## 2021-09-12 DIAGNOSIS — I82220 Acute embolism and thrombosis of inferior vena cava: Secondary | ICD-10-CM

## 2021-09-12 DIAGNOSIS — Z8669 Personal history of other diseases of the nervous system and sense organs: Secondary | ICD-10-CM

## 2021-09-12 DIAGNOSIS — Z882 Allergy status to sulfonamides status: Secondary | ICD-10-CM | POA: Diagnosis not present

## 2021-09-12 DIAGNOSIS — R6510 Systemic inflammatory response syndrome (SIRS) of non-infectious origin without acute organ dysfunction: Secondary | ICD-10-CM | POA: Diagnosis present

## 2021-09-12 DIAGNOSIS — D62 Acute posthemorrhagic anemia: Secondary | ICD-10-CM | POA: Diagnosis not present

## 2021-09-12 DIAGNOSIS — E785 Hyperlipidemia, unspecified: Secondary | ICD-10-CM

## 2021-09-12 DIAGNOSIS — Z888 Allergy status to other drugs, medicaments and biological substances status: Secondary | ICD-10-CM | POA: Diagnosis not present

## 2021-09-12 DIAGNOSIS — Z20822 Contact with and (suspected) exposure to covid-19: Secondary | ICD-10-CM | POA: Diagnosis present

## 2021-09-12 DIAGNOSIS — Z91018 Allergy to other foods: Secondary | ICD-10-CM | POA: Diagnosis not present

## 2021-09-12 DIAGNOSIS — E039 Hypothyroidism, unspecified: Secondary | ICD-10-CM | POA: Diagnosis not present

## 2021-09-12 DIAGNOSIS — Z79899 Other long term (current) drug therapy: Secondary | ICD-10-CM | POA: Diagnosis not present

## 2021-09-12 DIAGNOSIS — I8290 Acute embolism and thrombosis of unspecified vein: Secondary | ICD-10-CM | POA: Diagnosis present

## 2021-09-12 DIAGNOSIS — Y838 Other surgical procedures as the cause of abnormal reaction of the patient, or of later complication, without mention of misadventure at the time of the procedure: Secondary | ICD-10-CM | POA: Diagnosis present

## 2021-09-12 DIAGNOSIS — Z885 Allergy status to narcotic agent status: Secondary | ICD-10-CM | POA: Diagnosis not present

## 2021-09-12 DIAGNOSIS — E878 Other disorders of electrolyte and fluid balance, not elsewhere classified: Secondary | ICD-10-CM | POA: Diagnosis present

## 2021-09-12 DIAGNOSIS — Z87891 Personal history of nicotine dependence: Secondary | ICD-10-CM | POA: Diagnosis not present

## 2021-09-12 DIAGNOSIS — E063 Autoimmune thyroiditis: Secondary | ICD-10-CM | POA: Diagnosis present

## 2021-09-12 DIAGNOSIS — G43909 Migraine, unspecified, not intractable, without status migrainosus: Secondary | ICD-10-CM | POA: Diagnosis present

## 2021-09-12 DIAGNOSIS — Z9889 Other specified postprocedural states: Secondary | ICD-10-CM

## 2021-09-12 DIAGNOSIS — E871 Hypo-osmolality and hyponatremia: Secondary | ICD-10-CM | POA: Diagnosis present

## 2021-09-12 DIAGNOSIS — T8189XA Other complications of procedures, not elsewhere classified, initial encounter: Secondary | ICD-10-CM | POA: Diagnosis present

## 2021-09-12 LAB — CBC
HCT: 37.5 % (ref 36.0–46.0)
Hemoglobin: 12.9 g/dL (ref 12.0–15.0)
MCH: 30.6 pg (ref 26.0–34.0)
MCHC: 34.4 g/dL (ref 30.0–36.0)
MCV: 88.9 fL (ref 80.0–100.0)
Platelets: 229 10*3/uL (ref 150–400)
RBC: 4.22 MIL/uL (ref 3.87–5.11)
RDW: 12.8 % (ref 11.5–15.5)
WBC: 8.7 10*3/uL (ref 4.0–10.5)
nRBC: 0 % (ref 0.0–0.2)

## 2021-09-12 LAB — RESP PANEL BY RT-PCR (FLU A&B, COVID) ARPGX2
Influenza A by PCR: NEGATIVE
Influenza B by PCR: NEGATIVE
SARS Coronavirus 2 by RT PCR: NEGATIVE

## 2021-09-12 LAB — BASIC METABOLIC PANEL
Anion gap: 12 (ref 5–15)
BUN: 7 mg/dL — ABNORMAL LOW (ref 8–23)
CO2: 21 mmol/L — ABNORMAL LOW (ref 22–32)
Calcium: 8.7 mg/dL — ABNORMAL LOW (ref 8.9–10.3)
Chloride: 106 mmol/L (ref 98–111)
Creatinine, Ser: 0.73 mg/dL (ref 0.44–1.00)
GFR, Estimated: >60 mL/min (ref >60)
Glucose, Bld: 97 mg/dL (ref 70–99)
Potassium: 4.2 mmol/L (ref 3.5–5.1)
Sodium: 139 mmol/L (ref 135–145)

## 2021-09-12 LAB — HEPARIN LEVEL (UNFRACTIONATED)
Heparin Unfractionated: 0.8 IU/mL — ABNORMAL HIGH (ref 0.30–0.70)
Heparin Unfractionated: 0.6 IU/mL (ref 0.30–0.70)

## 2021-09-12 LAB — HIV ANTIBODY (ROUTINE TESTING W REFLEX): HIV Screen 4th Generation wRfx: NONREACTIVE

## 2021-09-12 MED ORDER — CYCLOBENZAPRINE HCL 10 MG PO TABS: 10.0000 mg | ORAL_TABLET | Freq: Every day | ORAL | Status: DC

## 2021-09-12 MED ORDER — METHOCARBAMOL 500 MG PO TABS: 500.0000 mg | ORAL_TABLET | Freq: Every day | ORAL | Status: DC | PRN

## 2021-09-12 MED ORDER — SUMATRIPTAN SUCCINATE 50 MG PO TABS
50.0000 mg | ORAL_TABLET | ORAL | Status: DC | PRN
  Administered 2021-09-13: 04:00:00 50 mg via ORAL
  Filled 2021-09-12 (×2): qty 1

## 2021-09-12 MED ORDER — CYCLOBENZAPRINE HCL 10 MG PO TABS
10.0000 mg | ORAL_TABLET | Freq: Every evening | ORAL | Status: DC | PRN
  Administered 2021-09-12 – 2021-09-13 (×2): 10 mg via ORAL
  Filled 2021-09-12 (×2): qty 1

## 2021-09-12 MED ORDER — ROSUVASTATIN CALCIUM 5 MG PO TABS
2.5000 mg | ORAL_TABLET | Freq: Every day | ORAL | Status: DC
  Administered 2021-09-12 – 2021-09-14 (×3): 2.5 mg via ORAL
  Filled 2021-09-12 (×3): qty 1

## 2021-09-12 MED ORDER — FENTANYL CITRATE PF 50 MCG/ML IJ SOSY
50.0000 ug | PREFILLED_SYRINGE | Freq: Once | INTRAMUSCULAR | Status: AC
  Administered 2021-09-12: 50 ug via INTRAVENOUS
  Filled 2021-09-12: qty 1

## 2021-09-12 MED ORDER — HEPARIN BOLUS VIA INFUSION
4000.0000 [IU] | Freq: Once | INTRAVENOUS | Status: AC
  Administered 2021-09-12: 4000 [IU] via INTRAVENOUS

## 2021-09-12 MED ORDER — HEPARIN (PORCINE) 25000 UT/250ML-% IV SOLN
950.0000 [IU]/h | INTRAVENOUS | Status: AC
  Administered 2021-09-12: 1200 [IU]/h via INTRAVENOUS
  Administered 2021-09-12 – 2021-09-14 (×3): 1100 [IU]/h via INTRAVENOUS
  Filled 2021-09-12 (×5): qty 250

## 2021-09-12 MED ORDER — ROSUVASTATIN CALCIUM 5 MG PO TABS: 2.5000 mg | ORAL_TABLET | Freq: Every day | ORAL | Status: DC

## 2021-09-12 MED ORDER — LEVOTHYROXINE SODIUM 88 MCG PO TABS: 88.0000 ug | ORAL_TABLET | Freq: Every day | ORAL | Status: DC

## 2021-09-12 MED ORDER — LEVOTHYROXINE SODIUM 88 MCG PO TABS
88.0000 ug | ORAL_TABLET | Freq: Every day | ORAL | Status: DC
  Administered 2021-09-12 – 2021-09-14 (×3): 88 ug via ORAL
  Filled 2021-09-12 (×3): qty 1

## 2021-09-12 MED ORDER — ASPIRIN EC 81 MG PO TBEC
81.0000 mg | DELAYED_RELEASE_TABLET | Freq: Every day | ORAL | Status: DC
  Administered 2021-09-12 – 2021-09-15 (×3): 81 mg via ORAL
  Filled 2021-09-12 (×3): qty 1

## 2021-09-12 MED ORDER — ZOLPIDEM TARTRATE 5 MG PO TABS: 5.0000 mg | ORAL_TABLET | Freq: Every evening | ORAL | Status: DC | PRN

## 2021-09-12 MED ORDER — OXYCODONE-ACETAMINOPHEN 5-325 MG PO TABS
1.0000 | ORAL_TABLET | ORAL | Status: DC | PRN
  Administered 2021-09-12 – 2021-09-14 (×3): 1 via ORAL
  Filled 2021-09-12 (×3): qty 1

## 2021-09-12 MED ORDER — PERPHENAZINE 2 MG PO TABS
2.0000 mg | ORAL_TABLET | ORAL | Status: DC | PRN
  Administered 2021-09-14 – 2021-09-15 (×2): 2 mg via ORAL
  Filled 2021-09-12 (×4): qty 1

## 2021-09-12 MED ORDER — METHOCARBAMOL 500 MG PO TABS
500.0000 mg | ORAL_TABLET | Freq: Every day | ORAL | Status: DC | PRN
  Administered 2021-09-12: 500 mg via ORAL
  Filled 2021-09-12: qty 1

## 2021-09-12 MED ORDER — SODIUM CHLORIDE 0.9% FLUSH
3.0000 mL | Freq: Two times a day (BID) | INTRAVENOUS | Status: DC
  Administered 2021-09-12 – 2021-09-15 (×7): 3 mL via INTRAVENOUS

## 2021-09-12 NOTE — Progress Notes (Signed)
  [facility] will assume care on arrival to accepting facility. Until arrival, care as per EDP. However, [facility] available 24/7 for questions and assistance.   Nursing staff please page [facility] Admits and Consults ([phone]) as soon as the patient arrives to the hospital.  [name], DO [facility]  

## 2021-09-12 NOTE — ED Provider Notes (Signed)
I assumed care of this patient.  Please see previous provider note for further details of Hx, PE.  Briefly patient is a 71 y.o. female who presented right upper quadrant pain.  Initial labs notable for mild leukocytosis.  No evidence of bili obstruction or pancreatitis.  UA suspicious for possible urinary tract infection but not convincing as patient is not having any urinary symptoms.  Patient is currently awaiting a CT scan.  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  Thrombus is near complete occlusive.  She does not have any lower extremity edema or lower abdominal pain.  I consulted vascular surgery and spoke with Dr. Unk Lightning.  He requested patient be admitted to hospital and started on heparin.  Vascular surgery will see in the morning to determine need for surgery or thrombectomy.  Will admit to medicine service.     Fatima Blank, MD 09/12/21 0127

## 2021-09-12 NOTE — Progress Notes (Signed)
[facility] for heparin Indication:  IVC thrombus  Allergies  Allergen Reactions   Citric Acid     migraines   Codeine Nausea And Vomiting    Headache & shakes   Other Nausea And Vomiting    MSG - migraines   Statins     Muscle weakness, tolerates low doses of crestor   Tape Hives   Epinephrine Palpitations   Sulfa Antibiotics Nausea And Vomiting and Rash    Patient Measurements: Height: 5\' 3"  (160 cm) Weight: 69.9 kg (154 lb) IBW/kg (Calculated) : 52.4  Vital Signs: Temp: 98.7 F (37.1 C) (01/07 0902) Temp Source: Oral (01/07 0902) BP: 141/73 (01/07 0902) Pulse Rate: 96 (01/07 0902)  Labs: Recent Labs    09/11/21 1940 09/12/21 0913  HGB 13.8  --   HCT 41.5  --   PLT 266  --   HEPARINUNFRC  --  0.80*  CREATININE 0.77  --      Estimated Creatinine Clearance: 62.2 mL/min (by C-G formula based on SCr of 0.77 mg/dL).  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  Heparin level is above goal at 0.8 on 1200 units/hr. No bleeding or infusion issues noted per RN. CBC is normal.  Goal of Therapy:  Heparin level 0.3-0.7 units/ml Monitor platelets by anticoagulation protocol: Yes   Plan:  Decrease heparin infusion to 1100 units/hr 6 hr heparin level Monitor heparin levels and CBC daily Monitor for s/sx of bleeding  Thank you for involving pharmacy in this patient's care.  Renold Genta, PharmD, BCPS Clinical Pharmacist Clinical phone for 09/12/2021 until 3p is [phone] 09/12/2021 10:05 AM  **Pharmacist phone directory can be found on [email or web address] listed under [place]**

## 2021-09-12 NOTE — Progress Notes (Signed)
ANTICOAGULATION CONSULT NOTE  Pharmacy Consult for heparin Indication:  IVC thrombus  Allergies  Allergen Reactions   Citric Acid     migraines   Codeine Nausea And Vomiting    Headache & shakes   Other Nausea And Vomiting    MSG - migraines   Statins     Muscle weakness, tolerates low doses of crestor   Tape Hives   Epinephrine Palpitations   Sulfa Antibiotics Nausea And Vomiting and Rash    Patient Measurements: Height: 5\' 3"  (160 cm) Weight: 69.9 kg (154 lb) IBW/kg (Calculated) : 52.4  Vital Signs: Temp: 98.5 F (36.9 C) (01/07 1508) Temp Source: Oral (01/07 1508) BP: 146/78 (01/07 1508) Pulse Rate: 88 (01/07 1508)  Labs: Recent Labs    09/11/21 1940 09/12/21 0913 09/12/21 1109 09/12/21 1622  HGB 13.8  --  12.9  --   HCT 41.5  --  37.5  --   PLT 266  --  229  --   HEPARINUNFRC  --  0.80*  --  0.60  CREATININE 0.77  --  0.73  --      Estimated Creatinine Clearance: 62.2 mL/min (by C-G formula based on SCr of 0.73 mg/dL).  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  Heparin level is now at goal at 0.6 on 1100 units/hr. No bleeding or infusion issues noted per RN. CBC was normal this am.  Goal of Therapy:  Heparin level 0.3-0.7 units/ml Monitor platelets by anticoagulation protocol: Yes   Plan:  Continue heparin infusion at 1100 units/hr Monitor heparin levels and CBC daily Monitor for s/sx of bleeding  Thank you for involving pharmacy in this patient's care.  Erin Hearing PharmD., BCPS Clinical Pharmacist 09/12/2021 6:09 PM

## 2021-09-12 NOTE — H&P (Signed)
History and Physical    Nancy Anthony QQI:[id number] DOB: 04/11/1951 DOA: 09/11/2021  Referring MD/NP/PA: Kristopher Oppenheim, DO PCP: Harlan Stains, MD  Patient coming from: Transfer from [place]  Chief Complaint: Abdominal pain  I have personally briefly reviewed patient's old medical records in [place]   HPI: Nancy Anthony is a 71 y.o. female with medical history significant of hyperlipidemia, hypothyroidism, and anemia presents with complaints of increasing abdominal pain over the last week. She had just recently been hospitalized on 09/03/21 and underwent lumbar 2 kyphoplasty by Dr. Phylliss Bob after failing conservative measures from sustaining L2 compression fracture secondary to fall in 06/2021.  Since the surgery patient reported that her back pain had improved some and she had tried to wean herself off of the oxycodone and was only taken Tylenol.  However, this week and noticed that she started having epigastric bloating and right upper quadrant discomfort.  She initially attributed symptoms to eating too much the day before, but pain and symptoms worsened.  She noted associated symptoms of shakes, nausea, and had some diarrhea after taking Colace for 2 days.  Denies having any significant shortness of breath, chest pain, vomiting, leg swelling, or calf pain.  ED Course: Upon admission into the emergency department patient was noted to have temperature of 99 F, pulse 88-116, blood pressure 139/65-158/88, and all other vital signs maintained.  Labs from 1/6 significant for WBC 12.5, hemoglobin 13.8 sodium 133, chloride 96, and lactic acid 1.3.  Urinalysis noted moderate leukocytes, 40 ketones, no significant bacteria seen, and 11-20 WBCs.  Influenza and COVID-19 screening were negative.  CT scan of the abdomen and pelvis with contrast noted prior L2 kyphoplasty with cement extending into the right paravertebral veins and into the IVC with thrombosis noted of the infrarenal IVC.  Dr.  Virl Cagey of vascular surgery have been consulted.  Patient has been given 1 L normal saline IV fluids, fentanyl IV, and started on a heparin drip per pharmacy with bolus.  Review of Systems  Constitutional:  Positive for malaise/fatigue. Negative for fever.  Respiratory:  Negative for cough and shortness of breath.   Cardiovascular:  Negative for chest pain and leg swelling.  Gastrointestinal:  Positive for abdominal pain and diarrhea. Negative for nausea.  Genitourinary:  Negative for dysuria.  Musculoskeletal:  Positive for back pain.  Neurological:  Negative for loss of consciousness.  Psychiatric/Behavioral:  Negative for substance abuse.   Otherwise a complete 10 point review of systems was performed and negative except for noted above or in the HPI  Past Medical History:  Diagnosis Date   Anemia    Hashimoto's disease    71 years old   Hyperlipidemia    Hypothyroidism    Migraine    Pneumonia    Ruptured disk    Thyroid disease     Past Surgical History:  Procedure Laterality Date   BREAST BIOPSY     BREAST EXCISIONAL BIOPSY Right 25+ yrs ago   benign   BREAST EXCISIONAL BIOPSY Left 25+ yrs ago   benign   CHOLECYSTECTOMY N/A 11/15/2017   Procedure: LAPAROSCOPIC CHOLECYSTECTOMY WITH INTRAOPERATIVE CHOLANGIOGRAM ERAS PATHWAY;  Surgeon: Donnie Mesa, MD;  Location: [facility];  Service: General;  Laterality: N/A;   COLONOSCOPY     CYST REMOVAL L EAR Left 1997   DIAGNOSTIC LAPAROSCOPY     KYPHOPLASTY N/A 09/03/2021   Procedure: LUMBAR 2 KYPHOPLASTY;  Surgeon: Phylliss Bob, MD;  Location: [facility];  Service: Orthopedics;  Laterality: N/A;  reports that she quit smoking about 32 years ago. Her smoking use included cigarettes. She has never used smokeless tobacco. She reports that she does not drink alcohol and does not use drugs.  Allergies  Allergen Reactions   Citric Acid     migraines   Codeine Nausea And Vomiting    Headache & shakes   Other Nausea And Vomiting     MSG - migraines   Statins     Muscle weakness, tolerates low doses of crestor   Tape Hives   Epinephrine Palpitations   Sulfa Antibiotics Nausea And Vomiting and Rash    Family History  Problem Relation Age of Onset   Hyperthyroidism Mother    Hypothyroidism Father    Migraines Father    Prostate cancer Brother    Hypothyroidism Brother     Prior to Admission medications   Medication Sig Start Date End Date Taking? Authorizing Provider  acetaminophen (TYLENOL) 500 MG tablet Take 1,000 mg by mouth every 6 (six) hours as needed for moderate pain.    [provider]  Calcium-Magnesium-Vitamin D (CALCIUM MAGNESIUM PO) Take 1 tablet by mouth daily.    [provider]  Cholecalciferol (VITAMIN D) 125 MCG (5000 UT) CAPS Take 5,000 Units by mouth daily.    [provider]  Folic Acid-Vit R6-VEL F81 (FOLBEE) 2.5-25-1 MG TABS tablet Take 1 tablet by mouth daily.    [provider]  frovatriptan (FROVA) 2.5 MG tablet Take 2.5 mg by mouth as needed for migraine.    [provider]  methocarbamol (ROBAXIN) 500 MG tablet Take 1-2 tablets (500-1,000 mg total) by mouth in the morning. 09/03/21   Phylliss Bob, MD  NYSTATIN powder Apply 1 application topically 2 (two) times daily as needed. 04/19/21   [provider]  ondansetron (ZOFRAN ODT) 8 MG disintegrating tablet Take 1 tablet (8 mg total) by mouth every 8 (eight) hours as needed for nausea or vomiting. Patient not taking: Reported on 06/07/2019 10/29/17   Lajean Saver, MD  oxyCODONE-acetaminophen (PERCOCET/ROXICET) 5-325 MG tablet Take 1 tablet by mouth every 4 (four) hours as needed for severe pain. 09/03/21   Phylliss Bob, MD  perphenazine (TRILAFON) 4 MG tablet Take 2 mg by mouth as needed for migraine. PRN every 2 hours 05/30/19   [provider]  promethazine (PHENERGAN) 25 MG suppository Place 25-50 mg rectally as needed for nausea or vomiting.     [provider]   rosuvastatin (CRESTOR) 5 MG tablet Take 2.5 mg by mouth at bedtime.  10/12/17   [provider]  SYNTHROID 88 MCG tablet Take 88 mcg by mouth at bedtime.  10/28/17   [provider]    Physical Exam:  Constitutional: Elderly female currently appears to be in no acute distress Vitals:   09/12/21 0400 09/12/21 0500 09/12/21 0600 09/12/21 0902  BP: 139/65 (!) 147/75 (!) 148/78 (!) 141/73  Pulse: 89 88 92 96  Resp: 12 17 19 18   Temp:    98.7 F (37.1 C)  TempSrc:    Oral  SpO2: 100% 100% 100% 96%  Weight:      Height:       Eyes: PERRL, lids and conjunctivae normal ENMT: Mucous membranes are moist. Posterior pharynx clear of any exudate or lesions.  Neck: normal, supple  Respiratory: Appears clear to auscultation with no significant wheezes appreciated.  Normal respiratory effort able to speak in complete sentences. Cardiovascular: Regular rate and rhythm, no murmurs / rubs / gallops. No  extremity edema.  Abdomen: Abdominal binder present bowel sounds appreciated in all 4 quadrants.  Some tenderness to palpation of the epigastric and right upper quadrant. Musculoskeletal: no clubbing / cyanosis. No joint deformity upper and lower extremities. Good ROM, no contractures. Normal muscle tone.  Skin: no rashes, lesions, ulcers. No induration Neurologic: CN 2-12 grossly intact. Sensation intact, DTR normal. Strength 5/5 in all 4.  Psychiatric: Normal judgment and insight. Alert and oriented x 3. Normal mood.     Labs on Admission: I have personally reviewed following labs and imaging studies  CBC: Recent Labs  Lab 09/11/21 1940  WBC 12.5*  HGB 13.8  HCT 41.5  MCV 88.5  PLT 627   Basic Metabolic Panel: Recent Labs  Lab 09/11/21 1940  NA 133*  K 4.5  CL 96*  CO2 25  GLUCOSE 104*  BUN 13  CREATININE 0.77  CALCIUM 9.6   GFR: Estimated Creatinine Clearance: 62.2 mL/min (by C-G formula based on SCr of 0.77 mg/dL). Liver Function Tests: Recent Labs  Lab  09/11/21 1940  AST 12*  ALT 8  ALKPHOS 91  BILITOT 1.1  PROT 7.4  ALBUMIN 4.2   Recent Labs  Lab 09/11/21 1940  LIPASE 14   No results for input(s): AMMONIA in the last 168 hours. Coagulation Profile: No results for input(s): INR, PROTIME in the last 168 hours. Cardiac Enzymes: No results for input(s): CKTOTAL, CKMB, CKMBINDEX, TROPONINI in the last 168 hours. BNP (last 3 results) No results for input(s): PROBNP in the last 8760 hours. HbA1C: No results for input(s): HGBA1C in the last 72 hours. CBG: No results for input(s): GLUCAP in the last 168 hours. Lipid Profile: No results for input(s): CHOL, HDL, LDLCALC, TRIG, CHOLHDL, LDLDIRECT in the last 72 hours. Thyroid Function Tests: No results for input(s): TSH, T4TOTAL, FREET4, T3FREE, THYROIDAB in the last 72 hours. Anemia Panel: No results for input(s): VITAMINB12, FOLATE, FERRITIN, TIBC, IRON, RETICCTPCT in the last 72 hours. Urine analysis:    Component Value Date/Time   COLORURINE YELLOW 09/11/2021 2311   APPEARANCEUR CLEAR 09/11/2021 2311   LABSPEC 1.010 09/11/2021 2311   PHURINE 6.0 09/11/2021 2311   GLUCOSEU NEGATIVE 09/11/2021 2311   HGBUR SMALL (A) 09/11/2021 2311   BILIRUBINUR NEGATIVE 09/11/2021 2311   KETONESUR 40 (A) 09/11/2021 2311   PROTEINUR NEGATIVE 09/11/2021 2311   NITRITE NEGATIVE 09/11/2021 2311   LEUKOCYTESUR MODERATE (A) 09/11/2021 2311   Sepsis Labs: Recent Results (from the past 240 hour(s))  Resp Panel by RT-PCR (Flu A&B, Covid) Nasopharyngeal Swab     Status: None   Collection Time: 09/12/21  1:47 AM   Specimen: Nasopharyngeal Swab; Nasopharyngeal(NP) swabs in vial transport medium  Result Value Ref Range Status   SARS Coronavirus 2 by RT PCR NEGATIVE NEGATIVE Final    Comment: (NOTE) SARS-CoV-2 target nucleic acids are NOT DETECTED.  The SARS-CoV-2 RNA is generally detectable in upper respiratory specimens during the acute phase of infection. The lowest concentration of  SARS-CoV-2 viral copies this assay can detect is 138 copies/mL. A negative result does not preclude SARS-Cov-2 infection and should not be used as the sole basis for treatment or other patient management decisions. A negative result may occur with  improper specimen collection/handling, submission of specimen other than nasopharyngeal swab, presence of viral mutation(s) within the areas targeted by this assay, and inadequate number of viral copies(<138 copies/mL). A negative result must be combined with clinical observations, patient history, and epidemiological information. The expected result is Negative.  Fact Sheet for Patients:  EntrepreneurPulse.com.au  Fact Sheet for Healthcare Providers:  IncredibleEmployment.be  This test is no t yet approved or cleared by the [place] FDA and  has been authorized for detection and/or diagnosis of SARS-CoV-2 by FDA under an Emergency Use Authorization (EUA). This EUA will remain  in effect (meaning this test can be used) for the duration of the COVID-19 declaration under Section 564(b)(1) of the Act, 21 U.S.C.section 360bbb-3(b)(1), unless the authorization is terminated  or revoked sooner.       Influenza A by PCR NEGATIVE NEGATIVE Final   Influenza B by PCR NEGATIVE NEGATIVE Final    Comment: (NOTE) The Xpert Xpress SARS-CoV-2/FLU/RSV plus assay is intended as an aid in the diagnosis of influenza from Nasopharyngeal swab specimens and should not be used as a sole basis for treatment. Nasal washings and aspirates are unacceptable for Xpert Xpress SARS-CoV-2/FLU/RSV testing.  Fact Sheet for Patients: EntrepreneurPulse.com.au  Fact Sheet for Healthcare Providers: IncredibleEmployment.be  This test is not yet approved or cleared by the [place] FDA and has been authorized for detection and/or diagnosis of SARS-CoV-2 by FDA under an Emergency Use  Authorization (EUA). This EUA will remain in effect (meaning this test can be used) for the duration of the COVID-19 declaration under Section 564(b)(1) of the Act, 21 U.S.C. section 360bbb-3(b)(1), unless the authorization is terminated or revoked.  Performed at [facility], [address], [place], [place] [address]      Radiological Exams on Admission: CT ABDOMEN PELVIS W CONTRAST  Result Date: 09/12/2021 CLINICAL DATA:  Abdominal pain, acute, nonlocalized EXAM: CT ABDOMEN AND PELVIS WITH CONTRAST TECHNIQUE: Multidetector CT imaging of the abdomen and pelvis was performed using the standard protocol following bolus administration of intravenous contrast. CONTRAST:  154mL OMNIPAQUE IOHEXOL 300 MG/ML  SOLN COMPARISON:  None. FINDINGS: Lower chest: Lung bases are clear. No effusions. Heart is normal size. Hepatobiliary: No focal liver abnormality is seen. Status post cholecystectomy. No biliary dilatation. Pancreas: No focal abnormality or ductal dilatation. Spleen: No focal abnormality.  Normal size. Adrenals/Urinary Tract: Right upper pole renal cyst appears benign. No hydronephrosis. Adrenal glands and urinary bladder Stomach/Bowel: Stomach, large and small bowel grossly unremarkable. Vascular/Lymphatic: Aortic calcifications. No aneurysm. Cement from the L2 kyphoplasty extends in right paravertebral veins and into the IVC with thrombus seen around the cement in the IVC and extending inferiorly in the IVC. Reproductive: Uterus and adnexa unremarkable.  No mass. Other: No free fluid or free air. Musculoskeletal: No acute bony abnormality. Kyphoplasty changes noted at L2. IMPRESSION: Prior L2 kyphoplasty. Cement extends in right paravertebral veins and into the IVC with thrombus noted in the infrarenal IVC. These results were called by telephone at the time of interpretation on 09/12/2021 at 12:06 am to provider Dr. Eston Mould, Who verbally acknowledged these results. Electronically  Signed   By: Rolm Baptise M.D.   On: 09/12/2021 00:12      Assessment/Plan IVC thrombosis: Acute patient presents with complaints of epigastric and right upper quadrant abdominal pain and distention.  Found to have acute thrombus of the IVC thought secondary to leakage of cement from recent kyphoplasty on CT angiogram.  Vascular surgery have been consulted and patient was started on a heparin drip. -Admit to a telemetry bed -Heparin per pharmacy -Starting aspirin per Vascular -Oxycodone as needed for pain -Tentative plans for suction thrombectomy and possible inferior vena cava stenting on 1/10 -Appreciate vascular surgery consultative services,  will follow-up for any further recommendations  S/p kyphoplasty of L2 compression fracture: Patient suffered a fall back in 06/2021 and was found to have L2 compression fracture at that time.  Failed conservative measures and underwent kyphoplasty on 12/29 by Dr. Lynann Bologna of [facility]. -PA to Dr. Lynann Bologna at [facility] orthopedics notified regarding patient admission into the hospital  SIRS: Patient was noted to be tachycardic with WBC elevated at 12.5, but lactic acid was reassuring at 1.3.  Suspecting symptoms secondary to above. -Continue to monitor  Hypothyroidism -Continue levothyroxine  Dyslipidemia -Continue atorvastatin  History of migraines -As needed pharmacy substitution of Imitrex  Hyponatremia and hypochloremia: Acute.  On admission sodium was 133 and chloride 96.  DVT prophylaxis: Heparin Code Status: Full Family Communication: Daughter updated at bedside Disposition Plan: Hopefully discharge home once medically stable Consults called: Vascular surgery Admission status: Inpatient, require more than 2 midnight stay for likely need of procedure  Norval Morton MD [facility]   If 7PM-7AM, please contact night-coverage   09/12/2021, 10:27 AM

## 2021-09-12 NOTE — Consult Note (Addendum)
Hospital Consult    Reason for Consult: IVC thrombus Requesting Physician: Dr. Tamala Julian MRN #:  [id number]  History of Present Illness: This is a 71 y.o. female who initially presented to an urgent care with a 5-day history of abdominal pain.    On 09/03/2021, the patient underwent L2 kyphoplasty for osteoparotic L2 compression fracture.  Initially, the patient did well, however on postop day 4, she noted increased abdominal pain.  This was mild, and therefore the patient thought it was related to possible gas.  The pain continued to worsen prompting her to seek treatment in an urgent care.  Urgent care physician asked her to go to the emergency department for further work-up.  At [facility], the patient underwent CT angio abdomen pelvis demonstrating acute thrombus present in the inferior vena cava with likely nidus being cement from the kyphoplasty extending from a lumbar vein into the inferior vena cava.  On exam, Cipriano Bunker was doing well.  She denied pain in her feet, lower extremity swelling.  She denied urinary changes and stated that her abdominal pain was mild.  Surgical history includes cholecystectomy years ago.  Past Medical History:  Diagnosis Date   Anemia    Hashimoto's disease    71 years old   Hyperlipidemia    Hypothyroidism    Migraine    Pneumonia    Ruptured disk    Thyroid disease     Past Surgical History:  Procedure Laterality Date   BREAST BIOPSY     BREAST EXCISIONAL BIOPSY Right 25+ yrs ago   benign   BREAST EXCISIONAL BIOPSY Left 25+ yrs ago   benign   CHOLECYSTECTOMY N/A 11/15/2017   Procedure: LAPAROSCOPIC CHOLECYSTECTOMY WITH INTRAOPERATIVE CHOLANGIOGRAM ERAS PATHWAY;  Surgeon: Donnie Mesa, MD;  Location: [facility];  Service: General;  Laterality: N/A;   COLONOSCOPY     CYST REMOVAL L EAR Left 1997   DIAGNOSTIC LAPAROSCOPY     KYPHOPLASTY N/A 09/03/2021   Procedure: LUMBAR 2 KYPHOPLASTY;  Surgeon: Phylliss Bob, MD;  Location: [facility];  Service:  Orthopedics;  Laterality: N/A;    Allergies  Allergen Reactions   Citric Acid     migraines   Codeine Nausea And Vomiting    Headache & shakes   Other Nausea And Vomiting    MSG - migraines   Statins     Muscle weakness, tolerates low doses of crestor   Tape Hives   Epinephrine Palpitations   Sulfa Antibiotics Nausea And Vomiting and Rash    Prior to Admission medications   Medication Sig Start Date End Date Taking? Authorizing Provider  acetaminophen (TYLENOL) 500 MG tablet Take 1,000 mg by mouth every 6 (six) hours as needed for moderate pain.    [provider]  Calcium-Magnesium-Vitamin D (CALCIUM MAGNESIUM PO) Take 1 tablet by mouth daily.    [provider]  Cholecalciferol (VITAMIN D) 125 MCG (5000 UT) CAPS Take 5,000 Units by mouth daily.    [provider]  Folic Acid-Vit H0-QMV H84 (FOLBEE) 2.5-25-1 MG TABS tablet Take 1 tablet by mouth daily.    [provider]  frovatriptan (FROVA) 2.5 MG tablet Take 2.5 mg by mouth as needed for migraine.    [provider]  methocarbamol (ROBAXIN) 500 MG tablet Take 1-2 tablets (500-1,000 mg total) by mouth in the morning. 09/03/21   Phylliss Bob, MD  NYSTATIN powder Apply 1 application topically 2 (two) times daily as needed. 04/19/21   [provider]  ondansetron (ZOFRAN ODT)  8 MG disintegrating tablet Take 1 tablet (8 mg total) by mouth every 8 (eight) hours as needed for nausea or vomiting. Patient not taking: Reported on 06/07/2019 10/29/17   Lajean Saver, MD  oxyCODONE-acetaminophen (PERCOCET/ROXICET) 5-325 MG tablet Take 1 tablet by mouth every 4 (four) hours as needed for severe pain. 09/03/21   Phylliss Bob, MD  perphenazine (TRILAFON) 4 MG tablet Take 2 mg by mouth as needed for migraine. PRN every 2 hours 05/30/19   [provider]  promethazine (PHENERGAN) 25 MG suppository Place 25-50 mg rectally as needed for nausea or vomiting.     [provider]   rosuvastatin (CRESTOR) 5 MG tablet Take 2.5 mg by mouth at bedtime.  10/12/17   [provider]  SYNTHROID 88 MCG tablet Take 88 mcg by mouth at bedtime.  10/28/17   [provider]    Social History   Socioeconomic History   Marital status: Widowed    Spouse name: Not on file   Number of children: 1   Years of education: Not on file   Highest education level: Master's degree (e.g., MA, MS, MEng, MEd, MSW, MBA)  Occupational History   Not on file  Tobacco Use   Smoking status: Former    Types: Cigarettes    Quit date: 12/1988    Years since quitting: 32.7   Smokeless tobacco: Never  Vaping Use   Vaping Use: Never used  Substance and Sexual Activity   Alcohol use: No    Comment: quit 1996   Drug use: No   Sexual activity: Yes    Partners: Male  Other Topics Concern   Not on file  Social History Narrative   Regular exercise: no   Caffeine use: 1-2 cups coffee per day    Lives at home alone with her dog   Right handed   Social Determinants of Health   Financial Resource Strain: Not on file  Food Insecurity: Not on file  Transportation Needs: Not on file  Physical Activity: Not on file  Stress: Not on file  Social Connections: Not on file  Intimate Partner Violence: Not on file     Family History  Problem Relation Age of Onset   Hyperthyroidism Mother    Hypothyroidism Father    Migraines Father    Prostate cancer Brother    Hypothyroidism Brother     ROS: Otherwise negative unless mentioned in HPI  Physical Examination  Vitals:   09/12/21 0600 09/12/21 0902  BP: (!) 148/78 (!) 141/73  Pulse: 92 96  Resp: 19 18  Temp:  98.7 F (37.1 C)  SpO2: 100% 96%   Body mass index is 27.28 kg/m.  General:  WDWN in NAD Gait: Not observed HENT: WNL, normocephalic Pulmonary: normal non-labored breathing Cardiac: regular Abdomen:  soft, NT/ND, no masses, wearing a brace due to back surgery Skin: without rashes Vascular Exam/Pulses: 2+ DP  and PT bilaterally Extremities: without ischemic changes, without Gangrene , without cellulitis; without open wounds;  Musculoskeletal: no muscle wasting or atrophy  Neurologic: A&O X 3;  No focal weakness or paresthesias are detected; speech is fluent/normal Psychiatric:  The pt has Normal affect. Lymph:  Unremarkable  CBC    Component Value Date/Time   WBC 12.5 (H) 09/11/2021 1940   RBC 4.69 09/11/2021 1940   HGB 13.8 09/11/2021 1940   HCT 41.5 09/11/2021 1940   PLT 266 09/11/2021 1940   MCV 88.5 09/11/2021 1940   MCH 29.4 09/11/2021 1940  MCHC 33.3 09/11/2021 1940   RDW 13.1 09/11/2021 1940   LYMPHSABS 1.2 08/27/2021 1018   MONOABS 0.6 08/27/2021 1018   EOSABS 0.2 08/27/2021 1018   BASOSABS 0.1 08/27/2021 1018    BMET    Component Value Date/Time   NA 133 (L) 09/11/2021 1940   K 4.5 09/11/2021 1940   CL 96 (L) 09/11/2021 1940   CO2 25 09/11/2021 1940   GLUCOSE 104 (H) 09/11/2021 1940   BUN 13 09/11/2021 1940   CREATININE 0.77 09/11/2021 1940   CALCIUM 9.6 09/11/2021 1940   GFRNONAA >60 09/11/2021 1940   GFRAA >60 10/29/2017 0733    COAGS: Lab Results  Component Value Date   INR 0.9 08/27/2021     Non-Invasive Vascular Imaging:     Statin:  Yes.   Beta Blocker:  No. Aspirin:  No. ACEI:  No. ARB:  No. CCB use:  No Other antiplatelets/anticoagulants:  Yes.   Heparin    ASSESSMENT/PLAN: This is a 71 y.o. 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  This cement is likely the nidus for surrounding thrombus.  There does appear to be a small flow channel, and fortunately the patient is relatively asymptomatic at this time.  No signs of renal dysfunction, no lower extremity swelling.  Patient currently being treated with therapeutic heparin. I had a long discussion with Tineka regarding the above and the benefit of suction thrombectomy in an effort to debulk the thrombus in her inferior vena cava.  This would likely lead to complete  resolution of abdominal pain. I am unsure what to do about the projection of cement and plan to talk to my partners about leaving it in place with her anticoagulated versus trapping it behind a wall stent.  Being that the patient is relatively asymptomatic, I have scheduled her for suction thrombectomy possible inferior vena cava stenting, on Monday.  We will have further discussions tomorrow and I will ensure all questions and concerns of been addressed.  Please continue heparin drip, please initiate aspirin 81 mg daily Patient can have a normal diet Patient can ambulate She was asked to call her nurse immediately should new onset bilateral lower extremity swelling or renal dysfunction occur   Cassandria Santee MD [facility] [phone] 09/12/2021  10:13 AM

## 2021-09-12 NOTE — Progress Notes (Signed)
ANTICOAGULATION CONSULT NOTE - Initial Consult  Pharmacy Consult for heparin Indication:  IVC thrombus  Allergies  Allergen Reactions   Citric Acid     migraines   Codeine Nausea And Vomiting    Headache & shakes   Other Nausea And Vomiting    MSG - migraines   Statins     Muscle weakness, tolerates low doses of crestor   Tape Hives   Epinephrine Palpitations   Sulfa Antibiotics Nausea And Vomiting and Rash    Patient Measurements: Height: 5\' 3"  (160 cm) Weight: 69.9 kg (154 lb) IBW/kg (Calculated) : 52.4  Vital Signs: Temp: 99 F (37.2 C) (01/06 1926) BP: 156/81 (01/07 0100) Pulse Rate: 102 (01/07 0100)  Labs: Recent Labs    09/11/21 1940  HGB 13.8  HCT 41.5  PLT 266  CREATININE 0.77    Estimated Creatinine Clearance: 62.2 mL/min (by C-G formula based on SCr of 0.77 mg/dL).   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  Goal of Therapy:  Heparin level 0.3-0.7 units/ml Monitor platelets by anticoagulation protocol: Yes   Plan:  Heparin 4000 units IV bolus x1 followed by infusion at 1200 units/hr. Monitor heparin levels and CBC.  Rogue Bussing 09/12/2021,1:23 AM

## 2021-09-12 NOTE — Progress Notes (Signed)
Events of yesterday and today noted. I have spoken with Dr. Virl Cagey and reviewed his consult. As previously noted, there is a very small extension of cement into the L2 segmental vein, extending very slightly into IVC. The cement projection itself is not occlusive, however the thrombus surrounding it is more extensive, although not currently occlusive.   I have spoken with Ms. Surles. She is stable, and states that her fracture pain continues to be resolved, although is complaining of abdominal pain. Dr. Virl Cagey feels that removing the thrombus is very likely to be successful, and is very likely to address her abdominal pain, and this is currently planned for Monday. Additionally, he is planning to discuss whether additional options may need to be considered with regards to the small projection of cement. I will continue to be in touch with Dr. Virl Cagey and Ms. Gossen with regards to treatment of her thrombus and potentially, the cement projection. I very much appreciate the assistance of Dr. Virl Cagey and his team.

## 2021-09-13 LAB — CBC
HCT: 35.7 % — ABNORMAL LOW (ref 36.0–46.0)
Hemoglobin: 12.1 g/dL (ref 12.0–15.0)
MCH: 30.3 pg (ref 26.0–34.0)
MCHC: 33.9 g/dL (ref 30.0–36.0)
MCV: 89.3 fL (ref 80.0–100.0)
Platelets: 207 10*3/uL (ref 150–400)
RBC: 4 MIL/uL (ref 3.87–5.11)
RDW: 12.9 % (ref 11.5–15.5)
WBC: 7.1 10*3/uL (ref 4.0–10.5)
nRBC: 0 % (ref 0.0–0.2)

## 2021-09-13 LAB — HEPARIN LEVEL (UNFRACTIONATED): Heparin Unfractionated: 0.57 IU/mL (ref 0.30–0.70)

## 2021-09-13 MED ORDER — ACETAMINOPHEN 325 MG PO TABS
650.0000 mg | ORAL_TABLET | Freq: Four times a day (QID) | ORAL | Status: DC | PRN
  Administered 2021-09-13 (×3): 650 mg via ORAL
  Filled 2021-09-13 (×3): qty 2

## 2021-09-13 NOTE — Progress Notes (Signed)
Pt due to go to surgery in the AM. Contacted Dr. Virl Cagey to obtain NPO effective midnight order, confirmed heparin will remain running over night until surgery. He states consent will be obtained in the morning.

## 2021-09-13 NOTE — Progress Notes (Signed)
PROGRESS NOTE  Nancy Anthony AOZ:[id number] DOB: 12-10-1950   PCP: Harlan Stains, MD  Patient is from: Home  DOA: 09/11/2021 LOS: 1  Chief complaints:  Chief Complaint  Patient presents with   Abdominal Pain     Brief Narrative / Interim history: 71 year old F with PMH of hypothyroidism, hyperlipidemia, anemia, cholecystectomy, L2 compression fracture s/p kyphoplasty on 09/03/2021 presenting with increasing epigastric and RUQ abdominal pain and nausea and admitted for IVC thrombosis.  CT abdomen and pelvis showed prior L2 kyphoplasty with cement extending into the right paravertebral veins and into the IVC with thrombosis noted of the infrarenal IVC.  Patient was started on IV heparin.  Vascular surgery consulted, and planning thrombectomy on 1/9.  His orthopedic surgeon has been notified as well.  Subjective: Seen and examined earlier this morning.  She reports "migraine" headache earlier this morning that has improved after medication.  Abdominal pain has almost gone.  She denies nausea, vision change, focal weakness, numbness or tingling.  She denies chest pain, dyspnea, dizziness, leg swelling or calf tenderness.  She denies UTI symptoms.  Objective: Vitals:   09/12/21 2308 09/13/21 0322 09/13/21 0807 09/13/21 1225  BP: (!) 127/59 (!) 146/74 (!) 124/57 121/89  Pulse: 82 90 87 78  Resp: 15 17 20 17   Temp: 98.3 F (36.8 C) 99.1 F (37.3 C) 98.4 F (36.9 C) 98.3 F (36.8 C)  TempSrc: Oral Oral Oral Oral  SpO2: 96% 96% 97% 99%  Weight:      Height:        Examination:  GENERAL: No apparent distress.  Nontoxic. HEENT: MMM.  Vision and hearing grossly intact.  NECK: Supple.  No apparent JVD.  RESP:  No IWOB.  Fair aeration bilaterally. CVS:  RRR. Heart sounds normal.  ABD/GI/GU: BS+. Abd soft, NTND.  MSK/EXT:  Moves extremities. No apparent deformity. No edema.  SKIN: no apparent skin lesion or wound NEURO: Awake, alert and oriented appropriately.  No apparent focal  neuro deficit. PSYCH: Calm. Normal affect.   Procedures:  None  Microbiology summarized: [id number] and influenza PCR nonreactive.  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  -Continue IV heparin. -Check lower extremity Dopplers to exclude DVT -Plan for suction thrombectomy on 1/9. -Patient's orthopedic surgeon notified.  IVC thrombosis: Acute patient presents with complaints of epigastric and right upper quadrant abdominal pain and distention.  Found to have acute thrombus of the IVC thought secondary to leakage of cement from recent kyphoplasty on CT angiogram.  Vascular surgery have been consulted and patient was started on a heparin drip. -Admit to a telemetry bed -Heparin per pharmacy -Starting aspirin per Vascular -Oxycodone as needed for pain -Tentative plans for suction thrombectomy and possible inferior vena cava stenting on 1/10 -Appreciate vascular surgery consultative services,  will follow-up for any further recommendations   L2 compression fracture s/p kyphoplasty on 09/03/2021 by Dr. Lynann Bologna -CT shows cement extension in right paravertebral veins and IVC as above.   -Per orthopedic surgery   SIRS: With leukocytosis and tachycardia.  Likely from dehydration.  UA with ketonuria suggesting dehydration.  No clear source of infection.  She has no respiratory or UTI symptoms.  GI symptoms improved.   Hypothyroidism -Continue levothyroxine   Dyslipidemia -Continue atorvastatin   History of migraines -As needed pharmacy substitution of Imitrex   Hyponatremia and hypochloremia: Likely from dehydration.  Resolved.  Body mass index is 27.28 kg/m.         DVT prophylaxis:  On IV heparin for IVC thrombosis  Code Status: Full code Family Communication: Patient and/or RN. Available if any question.  Level of care: Progressive Status is: Inpatient  Remains inpatient appropriate because: IVC thrombosis requiring IV  heparin and further intervention by vascular surgery       Consultants:  Vascular surgery Orthopedic surgery   Sch Meds:  Scheduled Meds:  aspirin EC  81 mg Oral Daily   levothyroxine  88 mcg Oral QHS   rosuvastatin  2.5 mg Oral QHS   sodium chloride flush  3 mL Intravenous Q12H   Continuous Infusions:  heparin 1,100 Units/hr (09/12/21 1923)   PRN Meds:.acetaminophen, cyclobenzaprine, oxyCODONE-acetaminophen, perphenazine, SUMAtriptan, zolpidem  Antimicrobials: Anti-infectives (From admission, onward)    None        I have personally reviewed the following labs and images: CBC: Recent Labs  Lab 09/11/21 1940 09/12/21 1109 09/13/21 0423  WBC 12.5* 8.7 7.1  HGB 13.8 12.9 12.1  HCT 41.5 37.5 35.7*  MCV 88.5 88.9 89.3  PLT 266 229 207   BMP &GFR Recent Labs  Lab 09/11/21 1940 09/12/21 1109  NA 133* 139  K 4.5 4.2  CL 96* 106  CO2 25 21*  GLUCOSE 104* 97  BUN 13 7*  CREATININE 0.77 0.73  CALCIUM 9.6 8.7*   Estimated Creatinine Clearance: 62.2 mL/min (by C-G formula based on SCr of 0.73 mg/dL). Liver & Pancreas: Recent Labs  Lab 09/11/21 1940  AST 12*  ALT 8  ALKPHOS 91  BILITOT 1.1  PROT 7.4  ALBUMIN 4.2   Recent Labs  Lab 09/11/21 1940  LIPASE 14   No results for input(s): AMMONIA in the last 168 hours. Diabetic: No results for input(s): HGBA1C in the last 72 hours. No results for input(s): GLUCAP in the last 168 hours. Cardiac Enzymes: No results for input(s): CKTOTAL, CKMB, CKMBINDEX, TROPONINI in the last 168 hours. No results for input(s): PROBNP in the last 8760 hours. Coagulation Profile: No results for input(s): INR, PROTIME in the last 168 hours. Thyroid Function Tests: No results for input(s): TSH, T4TOTAL, FREET4, T3FREE, THYROIDAB in the last 72 hours. Lipid Profile: No results for input(s): CHOL, HDL, LDLCALC, TRIG, CHOLHDL, LDLDIRECT in the last 72 hours. Anemia Panel: No results for input(s): VITAMINB12, FOLATE,  FERRITIN, TIBC, IRON, RETICCTPCT in the last 72 hours. Urine analysis:    Component Value Date/Time   COLORURINE YELLOW 09/11/2021 2311   APPEARANCEUR CLEAR 09/11/2021 2311   LABSPEC 1.010 09/11/2021 2311   PHURINE 6.0 09/11/2021 2311   GLUCOSEU NEGATIVE 09/11/2021 2311   HGBUR SMALL (A) 09/11/2021 2311   BILIRUBINUR NEGATIVE 09/11/2021 2311   KETONESUR 40 (A) 09/11/2021 2311   PROTEINUR NEGATIVE 09/11/2021 2311   NITRITE NEGATIVE 09/11/2021 2311   LEUKOCYTESUR MODERATE (A) 09/11/2021 2311   Sepsis Labs: Invalid input(s): PROCALCITONIN, [place]  Microbiology: Recent Results (from the past 240 hour(s))  Resp Panel by RT-PCR (Flu A&B, Covid) Nasopharyngeal Swab     Status: None   Collection Time: 09/12/21  1:47 AM   Specimen: Nasopharyngeal Swab; Nasopharyngeal(NP) swabs in vial transport medium  Result Value Ref Range Status   SARS Coronavirus 2 by RT PCR NEGATIVE NEGATIVE Final    Comment: (NOTE) SARS-CoV-2 target nucleic acids are NOT DETECTED.  The SARS-CoV-2 RNA is generally detectable in upper respiratory specimens during the acute phase of infection. The lowest concentration of SARS-CoV-2 viral copies this assay can detect is 138 copies/mL. A negative result does not preclude SARS-Cov-2 infection and should not  be used as the sole basis for treatment or other patient management decisions. A negative result may occur with  improper specimen collection/handling, submission of specimen other than nasopharyngeal swab, presence of viral mutation(s) within the areas targeted by this assay, and inadequate number of viral copies(<138 copies/mL). A negative result must be combined with clinical observations, patient history, and epidemiological information. The expected result is Negative.  Fact Sheet for Patients:  EntrepreneurPulse.com.au  Fact Sheet for Healthcare Providers:  IncredibleEmployment.be  This test is no t yet approved  or cleared by the [place] FDA and  has been authorized for detection and/or diagnosis of SARS-CoV-2 by FDA under an Emergency Use Authorization (EUA). This EUA will remain  in effect (meaning this test can be used) for the duration of the COVID-19 declaration under Section 564(b)(1) of the Act, 21 U.S.C.section 360bbb-3(b)(1), unless the authorization is terminated  or revoked sooner.       Influenza A by PCR NEGATIVE NEGATIVE Final   Influenza B by PCR NEGATIVE NEGATIVE Final    Comment: (NOTE) The Xpert Xpress SARS-CoV-2/FLU/RSV plus assay is intended as an aid in the diagnosis of influenza from Nasopharyngeal swab specimens and should not be used as a sole basis for treatment. Nasal washings and aspirates are unacceptable for Xpert Xpress SARS-CoV-2/FLU/RSV testing.  Fact Sheet for Patients: EntrepreneurPulse.com.au  Fact Sheet for Healthcare Providers: IncredibleEmployment.be  This test is not yet approved or cleared by the [place] FDA and has been authorized for detection and/or diagnosis of SARS-CoV-2 by FDA under an Emergency Use Authorization (EUA). This EUA will remain in effect (meaning this test can be used) for the duration of the COVID-19 declaration under Section 564(b)(1) of the Act, 21 U.S.C. section 360bbb-3(b)(1), unless the authorization is terminated or revoked.  Performed at [facility], [address], [place], [place] [address]     Radiology Studies: No results found.    [facility]  If 7PM-7AM, please contact night-coverage [email or web address] 09/13/2021, 12:43 PM

## 2021-09-13 NOTE — Progress Notes (Signed)
°  Transition of Care (TOC) Screening Note   Patient Details  Name: Nancy Anthony Date of Birth: 1951-03-30   Transition of Care [facility]) CM/SW Contact:    Alfredia Ferguson, LCSW Phone Number: 09/13/2021, 7:55 AM    Transition of Care Department [facility]) has reviewed patient and no TOC needs have been identified at this time. We will continue to monitor patient advancement through interdisciplinary progression rounds. If new patient transition needs arise, please place a TOC consult.

## 2021-09-13 NOTE — Progress Notes (Signed)
[place] for heparin Indication:  IVC thrombus  Allergies  Allergen Reactions   Citric Acid     migraines   Codeine Nausea And Vomiting    Headache & shakes   Other Nausea And Vomiting    MSG - migraines   Statins     Muscle weakness, tolerates low doses of crestor   Tape Hives   Epinephrine Palpitations   Sulfa Antibiotics Nausea And Vomiting and Rash    Patient Measurements: Height: 5\' 3"  (160 cm) Weight: 69.9 kg (154 lb) IBW/kg (Calculated) : 52.4  Vital Signs: Temp: 98.3 F (36.8 C) (01/08 1225) Temp Source: Oral (01/08 1225) BP: 121/89 (01/08 1225) Pulse Rate: 78 (01/08 1225)  Labs: Recent Labs    09/11/21 1940 09/12/21 0913 09/12/21 1109 09/12/21 1622 09/13/21 0423  HGB 13.8  --  12.9  --  12.1  HCT 41.5  --  37.5  --  35.7*  PLT 266  --  229  --  207  HEPARINUNFRC  --  0.80*  --  0.60 0.57  CREATININE 0.77  --  0.73  --   --      Estimated Creatinine Clearance: 62.2 mL/min (by C-G formula based on SCr of 0.73 mg/dL).  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  Heparin level is at goal at 0.57 on 1100 units/hr. No bleeding noted. CBC stable.  Goal of Therapy:  Heparin level 0.3-0.7 units/ml Monitor platelets by anticoagulation protocol: Yes   Plan:  Continue heparin infusion at 1100 units/hr Monitor heparin levels and CBC daily Monitor for s/sx of bleeding  Thank you for involving pharmacy in this patient's care.  Renold Genta, PharmD, BCPS Clinical Pharmacist Clinical phone for 09/13/2021 until 3p is [phone] 09/13/2021 12:38 PM  **Pharmacist phone directory can be found on [email or web address] listed under [place]**

## 2021-09-14 ENCOUNTER — Encounter (HOSPITAL_COMMUNITY)
Admission: EM | Disposition: A | Discharge status: Home / Self Care | Payer: Self-pay | Source: Home / Self Care | Attending: Student

## 2021-09-14 ENCOUNTER — Inpatient Hospital Stay (HOSPITAL_COMMUNITY): Payer: Medicare Other | Admitting: Certified Registered Nurse Anesthetist

## 2021-09-14 ENCOUNTER — Encounter (HOSPITAL_COMMUNITY): Payer: Self-pay | Admitting: Internal Medicine

## 2021-09-14 ENCOUNTER — Encounter (HOSPITAL_COMMUNITY): Payer: Medicare Other

## 2021-09-14 ENCOUNTER — Inpatient Hospital Stay (HOSPITAL_COMMUNITY): Payer: Medicare Other

## 2021-09-14 HISTORY — PX: ULTRASOUND GUIDANCE FOR VASCULAR ACCESS: SHX6516

## 2021-09-14 HISTORY — PX: THROMBECTOMY ILIAC ARTERY: SHX6405

## 2021-09-14 LAB — CBC
HCT: 33.8 % — ABNORMAL LOW (ref 36.0–46.0)
Hemoglobin: 11.2 g/dL — ABNORMAL LOW (ref 12.0–15.0)
MCH: 29.4 pg (ref 26.0–34.0)
MCHC: 33.1 g/dL (ref 30.0–36.0)
MCV: 88.7 fL (ref 80.0–100.0)
Platelets: 213 10*3/uL (ref 150–400)
RBC: 3.81 MIL/uL — ABNORMAL LOW (ref 3.87–5.11)
RDW: 12.8 % (ref 11.5–15.5)
WBC: 5.9 10*3/uL (ref 4.0–10.5)
nRBC: 0 % (ref 0.0–0.2)

## 2021-09-14 LAB — TYPE AND SCREEN
ABO/RH(D): O POS
Antibody Screen: NEGATIVE

## 2021-09-14 LAB — POCT ACTIVATED CLOTTING TIME
Activated Clotting Time: 179 seconds
Activated Clotting Time: 257 seconds
Activated Clotting Time: 269 seconds

## 2021-09-14 LAB — HEMOGLOBIN AND HEMATOCRIT, BLOOD
HCT: 31.3 % — ABNORMAL LOW (ref 36.0–46.0)
Hemoglobin: 10.5 g/dL — ABNORMAL LOW (ref 12.0–15.0)

## 2021-09-14 LAB — HEPARIN LEVEL (UNFRACTIONATED): Heparin Unfractionated: 0.47 IU/mL (ref 0.30–0.70)

## 2021-09-14 SURGERY — THROMBECTOMY, PERCUTANEOUS, TRANSLUMINAL
Anesthesia: General | Site: Groin | Laterality: Right

## 2021-09-14 MED ORDER — EPHEDRINE 5 MG/ML INJ
INTRAVENOUS | Status: AC
  Filled 2021-09-14: qty 10

## 2021-09-14 MED ORDER — ROCURONIUM BROMIDE 10 MG/ML (PF) SYRINGE
PREFILLED_SYRINGE | INTRAVENOUS | Status: AC
  Filled 2021-09-14: qty 20

## 2021-09-14 MED ORDER — CEFAZOLIN SODIUM-DEXTROSE 2-3 GM-%(50ML) IV SOLR
INTRAVENOUS | Status: DC | PRN
  Administered 2021-09-14: 2 g via INTRAVENOUS

## 2021-09-14 MED ORDER — HEPARIN 6000 UNIT IRRIGATION SOLUTION
Status: AC
  Filled 2021-09-14: qty 500

## 2021-09-14 MED ORDER — HEPARIN SODIUM (PORCINE) 1000 UNIT/ML IJ SOLN
INTRAMUSCULAR | Status: DC | PRN
Start: 2021-09-14 — End: 2021-09-14
  Administered 2021-09-14: 5000 [IU] via INTRAVENOUS
  Administered 2021-09-14: 2000 [IU] via INTRAVENOUS

## 2021-09-14 MED ORDER — IODIXANOL 320 MG/ML IV SOLN
INTRAVENOUS | Status: DC | PRN
  Administered 2021-09-14: 100 mL via INTRAVENOUS
  Administered 2021-09-14: 75 mL via INTRAVENOUS

## 2021-09-14 MED ORDER — PHENYLEPHRINE HCL-NACL 20-0.9 MG/250ML-% IV SOLN
INTRAVENOUS | Status: DC | PRN
  Administered 2021-09-14: 15 ug/min via INTRAVENOUS

## 2021-09-14 MED ORDER — FENTANYL CITRATE (PF) 100 MCG/2ML IJ SOLN: 25.0000 ug | INTRAMUSCULAR | Status: DC | PRN

## 2021-09-14 MED ORDER — CHLORHEXIDINE GLUCONATE 0.12 % MT SOLN: 15.0000 mL | Freq: Once | OROMUCOSAL | Status: AC

## 2021-09-14 MED ORDER — ESMOLOL HCL 100 MG/10ML IV SOLN
INTRAVENOUS | Status: AC
  Filled 2021-09-14: qty 10

## 2021-09-14 MED ORDER — LIDOCAINE 2% (20 MG/ML) 5 ML SYRINGE
INTRAMUSCULAR | Status: DC | PRN
  Administered 2021-09-14: 100 mg via INTRAVENOUS

## 2021-09-14 MED ORDER — SUCCINYLCHOLINE CHLORIDE 200 MG/10ML IV SOSY
PREFILLED_SYRINGE | INTRAVENOUS | Status: AC
  Filled 2021-09-14: qty 10

## 2021-09-14 MED ORDER — PROTAMINE SULFATE 10 MG/ML IV SOLN
INTRAVENOUS | Status: AC
  Filled 2021-09-14: qty 10

## 2021-09-14 MED ORDER — 0.9 % SODIUM CHLORIDE (POUR BTL) OPTIME
TOPICAL | Status: DC | PRN
  Administered 2021-09-14: 1000 mL

## 2021-09-14 MED ORDER — FENTANYL CITRATE (PF) 250 MCG/5ML IJ SOLN
INTRAMUSCULAR | Status: DC | PRN
  Administered 2021-09-14: 200 ug via INTRAVENOUS
  Administered 2021-09-14: 50 ug via INTRAVENOUS

## 2021-09-14 MED ORDER — LIDOCAINE 2% (20 MG/ML) 5 ML SYRINGE
INTRAMUSCULAR | Status: AC
  Filled 2021-09-14: qty 10

## 2021-09-14 MED ORDER — ONDANSETRON HCL 4 MG/2ML IJ SOLN
INTRAMUSCULAR | Status: DC | PRN
  Administered 2021-09-14: 4 mg via INTRAVENOUS

## 2021-09-14 MED ORDER — CHLORHEXIDINE GLUCONATE CLOTH 2 % EX PADS: 6.0000 | MEDICATED_PAD | Freq: Every day | CUTANEOUS | Status: DC

## 2021-09-14 MED ORDER — LACTATED RINGERS IV SOLN: INTRAVENOUS | Status: DC | PRN

## 2021-09-14 MED ORDER — HEPARIN 6000 UNIT IRRIGATION SOLUTION
Status: DC | PRN
  Administered 2021-09-14 (×2): 1

## 2021-09-14 MED ORDER — DEXAMETHASONE SODIUM PHOSPHATE 10 MG/ML IJ SOLN
INTRAMUSCULAR | Status: AC
  Filled 2021-09-14: qty 3

## 2021-09-14 MED ORDER — SUGAMMADEX SODIUM 200 MG/2ML IV SOLN
INTRAVENOUS | Status: DC | PRN
  Administered 2021-09-14: 200 mg via INTRAVENOUS

## 2021-09-14 MED ORDER — PROPOFOL 10 MG/ML IV BOLUS
INTRAVENOUS | Status: AC
  Filled 2021-09-14: qty 20

## 2021-09-14 MED ORDER — PROPOFOL 10 MG/ML IV BOLUS
INTRAVENOUS | Status: DC | PRN
  Administered 2021-09-14: 170 mg via INTRAVENOUS

## 2021-09-14 MED ORDER — CHLORHEXIDINE GLUCONATE 0.12 % MT SOLN
OROMUCOSAL | Status: AC
  Administered 2021-09-14: 15 mL via OROMUCOSAL
  Filled 2021-09-14: qty 15

## 2021-09-14 MED ORDER — ROCURONIUM BROMIDE 10 MG/ML (PF) SYRINGE
PREFILLED_SYRINGE | INTRAVENOUS | Status: DC | PRN
  Administered 2021-09-14: 40 mg via INTRAVENOUS

## 2021-09-14 MED ORDER — ORAL CARE MOUTH RINSE: 15.0000 mL | Freq: Once | OROMUCOSAL | Status: AC

## 2021-09-14 MED ORDER — LACTATED RINGERS IV SOLN: INTRAVENOUS | Status: DC

## 2021-09-14 MED ORDER — HEPARIN SODIUM (PORCINE) 1000 UNIT/ML IJ SOLN
INTRAMUSCULAR | Status: AC
  Filled 2021-09-14: qty 1

## 2021-09-14 MED ORDER — DEXAMETHASONE SODIUM PHOSPHATE 10 MG/ML IJ SOLN
INTRAMUSCULAR | Status: DC | PRN
Start: 2021-09-14 — End: 2021-09-14
  Administered 2021-09-14: 10 mg via INTRAVENOUS

## 2021-09-14 MED ORDER — CEFAZOLIN SODIUM-DEXTROSE 2-4 GM/100ML-% IV SOLN
INTRAVENOUS | Status: AC
  Filled 2021-09-14: qty 100

## 2021-09-14 MED ORDER — FENTANYL CITRATE (PF) 250 MCG/5ML IJ SOLN
INTRAMUSCULAR | Status: AC
  Filled 2021-09-14: qty 5

## 2021-09-14 MED ORDER — ONDANSETRON HCL 4 MG/2ML IJ SOLN
INTRAMUSCULAR | Status: AC
  Filled 2021-09-14: qty 6

## 2021-09-14 MED ORDER — ALBUMIN HUMAN 5 % IV SOLN: INTRAVENOUS | Status: DC | PRN

## 2021-09-14 SURGICAL SUPPLY — 50 items
CATH ANGIO 5F BER2 65CM (CATHETERS) ×1 IMPLANT
CATH DIAG EXPO 6F VENT PIG 145 (CATHETERS) ×1 IMPLANT
CATH QUICKCROSS .035X135CM (MICROCATHETER) ×1 IMPLANT
CATH QUICKCROSS SUPP .035X90CM (MICROCATHETER) ×1 IMPLANT
CATH RETRIEVER CLOT 16MMX105CM (CATHETERS) ×1 IMPLANT
CATHETER TRIEVER20 ASPIRATION (MISCELLANEOUS) ×1 IMPLANT
CATHETER TRIEVER24 ASPIRATION (MISCELLANEOUS) ×1 IMPLANT
CHLORAPREP W/TINT 26 (MISCELLANEOUS) ×3 IMPLANT
COVER DOME SNAP 22 D (MISCELLANEOUS) ×1 IMPLANT
COVER MAYO STAND STRL (DRAPES) ×3 IMPLANT
DEVICE ENSNARE  12MMX20MM (VASCULAR PRODUCTS) ×6
DEVICE ENSNARE 12MMX20MM (VASCULAR PRODUCTS) IMPLANT
DRAPE INCISE IOBAN 66X45 STRL (DRAPES) ×2 IMPLANT
DRSG TEGADERM 4X4.75 (GAUZE/BANDAGES/DRESSINGS) ×2 IMPLANT
FILTER FLOWSAVER DEVICE (MISCELLANEOUS) ×1 IMPLANT
GAUZE SPONGE 2X2 8PLY STRL LF (GAUZE/BANDAGES/DRESSINGS) IMPLANT
GLIDEWIRE ADV .035X260CM (WIRE) ×1 IMPLANT
GLOVE SRG 8 PF TXTR STRL LF DI (GLOVE) ×2 IMPLANT
GLOVE SURG UNDER LTX SZ8 (GLOVE) ×3 IMPLANT
GLOVE SURG UNDER POLY LF SZ8 (GLOVE) ×3
GOWN STRL REUS W/ TWL LRG LVL3 (GOWN DISPOSABLE) ×4 IMPLANT
GOWN STRL REUS W/TWL 2XL LVL3 (GOWN DISPOSABLE) ×3 IMPLANT
GOWN STRL REUS W/TWL LRG LVL3 (GOWN DISPOSABLE) ×4
KIT BASIN OR (CUSTOM PROCEDURE TRAY) ×3 IMPLANT
KIT DILATOR VASC 18G NDL (KITS) ×1 IMPLANT
NS IRRIG 1000ML POUR BTL (IV SOLUTION) ×3 IMPLANT
PACK ENDO MINOR (CUSTOM PROCEDURE TRAY) ×3 IMPLANT
PACK UNIVERSAL I (CUSTOM PROCEDURE TRAY) ×1 IMPLANT
SET MICROPUNCTURE 5F STIFF (MISCELLANEOUS) ×3 IMPLANT
SHEATH CLOT RETRIEVER 16FR (SHEATH) ×1 IMPLANT
SHEATH GUIDING 8.5FR 77X17 (SHEATH) ×1 IMPLANT
SHEATH PINNACLE 5F 10CM (SHEATH) ×4 IMPLANT
SPONGE GAUZE 2X2 STER 10/PKG (GAUZE/BANDAGES/DRESSINGS) ×1
STOPCOCK MORSE 400PSI 3WAY (MISCELLANEOUS) ×1 IMPLANT
SUT MNCRL AB 4-0 PS2 18 (SUTURE) ×1 IMPLANT
SUT SILK 2 0 (SUTURE) ×2
SUT SILK 2 0 PERMA HAND 18 BK (SUTURE) ×1 IMPLANT
SUT SILK 2-0 18XBRD TIE 12 (SUTURE) IMPLANT
SYR 50ML LL SCALE MARK (SYRINGE) ×1 IMPLANT
SYR MEDRAD MARK V 150ML (SYRINGE) ×1 IMPLANT
TOWEL GREEN STERILE (TOWEL DISPOSABLE) ×3 IMPLANT
TOWEL GREEN STERILE FF (TOWEL DISPOSABLE) IMPLANT
TRAY FOLEY MTR SLVR 14FR STAT (SET/KITS/TRAYS/PACK) ×1 IMPLANT
TUBE CONNECTING 12X1/4 (SUCTIONS) ×1 IMPLANT
TUBING INJECTOR 48 (MISCELLANEOUS) ×1 IMPLANT
WATER STERILE IRR 1000ML POUR (IV SOLUTION) ×1 IMPLANT
WIRE AMPLATZ SS-J .035X180CM (WIRE) ×2 IMPLANT
WIRE AMPLATZ SSTIFF .035X260CM (WIRE) ×1 IMPLANT
WIRE BENTSON .035X145CM (WIRE) ×4 IMPLANT
WIRE ROSEN 145CM (WIRE) ×1 IMPLANT

## 2021-09-14 NOTE — Progress Notes (Signed)
°  Daily Progress Note   Subjective: Doing well, no abdominal complaints  Objective: Vitals:   09/14/21 0711 09/14/21 1000  BP:  (!) 176/70  Pulse:  86  Resp:  18  Temp: 98.5 F (36.9 C) 98.2 F (36.8 C)  SpO2:  98%    Physical Examination General:  WDWN in NAD Gait: Not observed HENT: WNL, normocephalic Pulmonary: normal non-labored breathing Cardiac: regular Abdomen:  soft, NT/ND, no masses, wearing a brace due to back surgery Skin: without rashes Vascular Exam/Pulses: 2+ DP and PT bilaterally Extremities: without ischemic changes, without Gangrene , without cellulitis; without open wounds;  Musculoskeletal: no muscle wasting or atrophy       Neurologic: A&O X 3;  No focal weakness or paresthesias are detected; speech is fluent/normal Psychiatric:  The pt has Normal affect. Lymph:  Unremarkable  ASSESSMENT/PLAN:   This is a 71 y.o. 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  This cement is likely the nidus for surrounding thrombus.  There does appear to be a small flow channel, and fortunately the patient is relatively asymptomatic at this time.  No signs of renal dysfunction, no lower extremity swelling.   Patient currently being treated with therapeutic heparin. I had a long discussion with Marielouise regarding the above and the benefit of suction thrombectomy in an effort to debulk the thrombus in her inferior vena cava.  These results will drive management of the cement  I discussed the risk and benefits, Candid elected to proceed.   Cassandria Santee MD MS [facility] [phone] 09/14/2021  1:11 PM

## 2021-09-14 NOTE — Op Note (Signed)
NAME: Nancy Anthony    MRN: [id number] DOB: 1951-07-29    DATE OF OPERATION: 09/14/2021  PREOP DIAGNOSIS:    Inferior vena cava thrombosis  POSTOP DIAGNOSIS:    Same  PROCEDURE:    Ultrasound-guided micropuncture access of bilateral common femoral arteries Percutaneous suction thrombectomy of the inferior vena cava, left pulmonary artery Foreign body removal from the inferior vena cava, left pulmonary artery  SURGEON: Broadus John  ASSIST: Dr. Fortunato Curling  ANESTHESIA: General  EBL: 1 L  INDICATIONS:    Nancy Anthony is a 71 y.o. 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  This cement is likely the nidus for surrounding thrombus.  There does appear to be a small flow channel, and fortunately the patient is relatively asymptomatic at this time.  No signs of renal dysfunction, no lower extremity swelling.   Patient currently being treated with therapeutic heparin. I had a long discussion with Nancy Anthony regarding the above and the benefit of suction thrombectomy in an effort to debulk the thrombus in her inferior vena cava.  These results will drive management of the cement   I discussed the risk and benefits, Nancy Anthony elected to proceed.  FINDINGS:   Occlusion of the inferior vena cava Cement from previous kyphoplasty appreciated in the inferior vena cava  TECHNIQUE:   The patient was brought to the OR laid in the supine position.  General anesthesia was induced and the patient was prepped and draped in standard fashion.  The case began with ultrasound-guided micropuncture access of bilateral common femoral veins.  From this access the right common femoral vein was dilated to 12f to accommodate the [place] Flow-Triever sheath.  The left common femoral vein access was dilated to 16 [place] to accommodate the Clot-Triever.  Cavogram followed demonstrating occlusion of the inferior vena cava with reconstitution at the level of the renal veins.  2  Amplatz wires were run from the access points into the left subclavian vein.  Next the Clot-Triever device was brought onto the field and positioned above the renal veins and opened for use as a temporary filter.  The 24 French suction thrombectomy device was brought onto the field and used in succession to remove thrombus from the inferior vena cava.  This device removed a significant amount of thrombus that appeared both acute and subacute.  My plan was to snare the protruding piece of cement and removed it.  This required several passes with the 24 French suction thrombectomy as well as the steerable 22 French suction thrombectomy device.  I was finally able to snare the segment with a small portion removed.  At the time, I thought that the small portion was the only portion within the inferior vena cava, with the remainder in the lumbar vein.  The clot Triever was then used to remove residual clot from the inferior vena cava.  Upon removal, this demonstrated organized thrombus removal.  On the third pass, more cement was disrupted.  Initially this appeared to be in the device, however on further inspection, it was outside of the device.  On device removal, the small pieces of cement were seen embolizing distally into the left pulmonary artery.  I used a long Bentson wire and angled pigtail catheter to guide me into the left pulmonary artery and tried multiple attempts at snaring the pieces of cement without avail.  At one point a small piece of cement was captured with the 24 [place] flow Triever device and upon removal  was lost into the segmental artery and the right lung.  6 French snare was again used and was successful in grabbing the cement out of the left pulmonary artery.  Once again, upon removal in the inferior vena cava this was lost, fortunately it was resnared and pulled out of the body.  At this point, the patient was stable with no respiratory compromise.  There was a small portion of cement left  in a segmental branch in the right lung.  She was oxygenating normally.  I made the decision to terminate the case for patient safety. Bilateral sheaths were removed and groins closed using Monocryl suture.  She was taken the PACU in stable condition where she was extubated to room air with no abdominal pain.  Given the complexity of the case a first assistant was necessary in order to expedient the procedure and safely perform the technical aspects of the operation.  Impression:  Successful recanalization of the inferior vena cava using suction thrombectomy Removal of intravenous cement from previous kyphoplasty. Small portion of residual cement lodged in a left segmental pulmonary branch.  Patient will continue with anticoagulation.  I will continue to follow her while in the hospital and will see her in 1 month for follow-up with caval duplex ultrasound. My plan will be at minimum 6 months anticoagulation, possible lifelong.  Nancy Burows, MD [facility]  DATE OF DICTATION:   09/14/2021

## 2021-09-14 NOTE — Transfer of Care (Signed)
Immediate Anesthesia Transfer of Care Note  Patient: Nancy Anthony  Procedure(s) Performed: PERCUTANEOUS INFERIOR VENA CAVA SUCTION THROMBECTOMY (Bilateral: Groin) ULTRASOUND GUIDANCE FOR VASCULAR ACCESS (Bilateral: Groin) THROMBECTOMY PULMONARY ARTERY (Right: Groin)  Patient Location: PACU  Anesthesia Type:General  Level of Consciousness: awake, alert  and oriented  Airway & Oxygen Therapy: Patient Spontanous Breathing  Post-op Assessment: Report given to RN and Post -op Vital signs reviewed and stable  Post vital signs: Reviewed and stable  Last Vitals:  Vitals Value Taken Time  BP 129/49 09/14/21 1730  Temp    Pulse 105 09/14/21 1733  Resp 16 09/14/21 1733  SpO2 100 % 09/14/21 1733  Vitals shown include unvalidated device data.  Last Pain:  Vitals:   09/14/21 1010  TempSrc:   PainSc: 5       Patients Stated Pain Goal: 2 (36/12/24 4975)  Complications: No notable events documented.

## 2021-09-14 NOTE — Progress Notes (Signed)
PROGRESS NOTE  Nancy Anthony ACZ:[id number] DOB: [birth year]   PCP: Harlan Stains, MD  Patient is from: Home  DOA: 09/11/2021 LOS: 2  Chief complaints:  Chief Complaint  Patient presents with   Abdominal Pain     Brief Narrative / Interim history: 71 year old F with PMH of hypothyroidism, hyperlipidemia, anemia, cholecystectomy, L2 compression fracture s/p kyphoplasty on 09/03/2021 presenting with increasing epigastric and RUQ abdominal pain and nausea and admitted for IVC thrombosis.  CT abdomen and pelvis showed prior L2 kyphoplasty with cement extending into the right paravertebral veins and into the IVC with thrombosis noted of the infrarenal IVC.  Patient was started on IV heparin.  Vascular surgery consulted, and planning thrombectomy on 1/9.  His orthopedic surgeon has been notified as well.  Subjective: Patient was out of her room the whole day for vascular procedure.   Objective: Vitals:   09/13/21 2340 09/14/21 0344 09/14/21 0711 09/14/21 1000  BP: 133/62 (!) 137/54  (!) 176/70  Pulse: 73 79  86  Resp: 18 13  18   Temp: 97.8 F (36.6 C) 98.2 F (36.8 C) 98.5 F (36.9 C) 98.2 F (36.8 C)  TempSrc: Oral Oral Oral Oral  SpO2: 100% 96%  98%  Weight:    70.3 kg  Height:    5\' 3"  (1.6 m)    Examination:  Did not examine patient today.  She was out of her room for vascular procedure the whole day.  Vitals and labs stable.  No major issues reported by RN staff  Procedures:  None  Microbiology summarized: [id number] and influenza PCR nonreactive.  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  -Continue IV heparin. -Check lower extremity Dopplers to exclude DVT -Suction thrombectomy today -Patient's orthopedic surgeon aware   L2 compression fracture s/p kyphoplasty on 09/03/2021 by Dr. Lynann Bologna -CT shows cement extension in right paravertebral veins and IVC as above.   -Per orthopedic surgery   SIRS: With  leukocytosis and tachycardia.  Likely from dehydration.  UA with ketonuria suggesting dehydration.  No clear source of infection.  She has no respiratory or UTI symptoms.  GI symptoms and SIRS resolved.  Hypothyroidism -Continue levothyroxine   Dyslipidemia -Continue atorvastatin   History of migraines -As needed pharmacy substitution of Imitrex   Hyponatremia and hypochloremia: Likely from dehydration.  Resolved.  Normocytic anemia: Likely due to acute illness and blood draws. -Monitor  Body mass index is 27.46 kg/m.         DVT prophylaxis:  On IV heparin for IVC thrombosis  Code Status: Full code Family Communication: Patient and/or RN. Available if any question.  Level of care: Progressive Status is: Inpatient  Remains inpatient appropriate because: IVC thrombosis requiring IV heparin and further intervention by vascular surgery       Consultants:  Vascular surgery Orthopedic surgery   Sch Meds:  Scheduled Meds:  [MAR Hold] aspirin EC  81 mg Oral Daily   [MAR Hold] levothyroxine  88 mcg Oral QHS   [MAR Hold] rosuvastatin  2.5 mg Oral QHS   [MAR Hold] sodium chloride flush  3 mL Intravenous Q12H   Continuous Infusions:  ceFAZolin     heparin 1,100 Units/hr (09/14/21 1331)   lactated ringers Stopped (09/14/21 1706)   PRN Meds:.0.9 % irrigation (POUR BTL), [MAR Hold] acetaminophen, [MAR Hold] cyclobenzaprine, fentaNYL (SUBLIMAZE) injection, heparin, iodixanol, [MAR Hold] oxyCODONE-acetaminophen, [MAR Hold] perphenazine, [MAR Hold] SUMAtriptan, [MAR Hold] zolpidem  Antimicrobials: Anti-infectives (From admission, onward)    Start  Dose/Rate Route Frequency Ordered Stop   09/14/21 1328  ceFAZolin (ANCEF) 2-4 GM/100ML-% IVPB       Note to Pharmacy: Cameron Sprang M: cabinet override      09/14/21 1328 09/15/21 0144        I have personally reviewed the following labs and images: CBC: Recent Labs  Lab 09/11/21 1940 09/12/21 1109  09/13/21 0423 09/14/21 0403  WBC 12.5* 8.7 7.1 5.9  HGB 13.8 12.9 12.1 11.2*  HCT 41.5 37.5 35.7* 33.8*  MCV 88.5 88.9 89.3 88.7  PLT 266 229 207 213   BMP &GFR Recent Labs  Lab 09/11/21 1940 09/12/21 1109  NA 133* 139  K 4.5 4.2  CL 96* 106  CO2 25 21*  GLUCOSE 104* 97  BUN 13 7*  CREATININE 0.77 0.73  CALCIUM 9.6 8.7*   Estimated Creatinine Clearance: 62.4 mL/min (by C-G formula based on SCr of 0.73 mg/dL). Liver & Pancreas: Recent Labs  Lab 09/11/21 1940  AST 12*  ALT 8  ALKPHOS 91  BILITOT 1.1  PROT 7.4  ALBUMIN 4.2   Recent Labs  Lab 09/11/21 1940  LIPASE 14   No results for input(s): AMMONIA in the last 168 hours. Diabetic: No results for input(s): HGBA1C in the last 72 hours. No results for input(s): GLUCAP in the last 168 hours. Cardiac Enzymes: No results for input(s): CKTOTAL, CKMB, CKMBINDEX, TROPONINI in the last 168 hours. No results for input(s): PROBNP in the last 8760 hours. Coagulation Profile: No results for input(s): INR, PROTIME in the last 168 hours. Thyroid Function Tests: No results for input(s): TSH, T4TOTAL, FREET4, T3FREE, THYROIDAB in the last 72 hours. Lipid Profile: No results for input(s): CHOL, HDL, LDLCALC, TRIG, CHOLHDL, LDLDIRECT in the last 72 hours. Anemia Panel: No results for input(s): VITAMINB12, FOLATE, FERRITIN, TIBC, IRON, RETICCTPCT in the last 72 hours. Urine analysis:    Component Value Date/Time   COLORURINE YELLOW 09/11/2021 2311   APPEARANCEUR CLEAR 09/11/2021 2311   LABSPEC 1.010 09/11/2021 2311   PHURINE 6.0 09/11/2021 2311   GLUCOSEU NEGATIVE 09/11/2021 2311   HGBUR SMALL (A) 09/11/2021 2311   BILIRUBINUR NEGATIVE 09/11/2021 2311   KETONESUR 40 (A) 09/11/2021 2311   PROTEINUR NEGATIVE 09/11/2021 2311   NITRITE NEGATIVE 09/11/2021 2311   LEUKOCYTESUR MODERATE (A) 09/11/2021 2311   Sepsis Labs: Invalid input(s): PROCALCITONIN, [place]  Microbiology: Recent Results (from the past 240 hour(s))   Resp Panel by RT-PCR (Flu A&B, Covid) Nasopharyngeal Swab     Status: None   Collection Time: 09/12/21  1:47 AM   Specimen: Nasopharyngeal Swab; Nasopharyngeal(NP) swabs in vial transport medium  Result Value Ref Range Status   SARS Coronavirus 2 by RT PCR NEGATIVE NEGATIVE Final    Comment: (NOTE) SARS-CoV-2 target nucleic acids are NOT DETECTED.  The SARS-CoV-2 RNA is generally detectable in upper respiratory specimens during the acute phase of infection. The lowest concentration of SARS-CoV-2 viral copies this assay can detect is 138 copies/mL. A negative result does not preclude SARS-Cov-2 infection and should not be used as the sole basis for treatment or other patient management decisions. A negative result may occur with  improper specimen collection/handling, submission of specimen other than nasopharyngeal swab, presence of viral mutation(s) within the areas targeted by this assay, and inadequate number of viral copies(<138 copies/mL). A negative result must be combined with clinical observations, patient history, and epidemiological information. The expected result is Negative.  Fact Sheet for Patients:  EntrepreneurPulse.com.au  Fact Sheet for Healthcare Providers:  IncredibleEmployment.be  This test is no t yet approved or cleared by the [place] and  has been authorized for detection and/or diagnosis of SARS-CoV-2 by FDA under an Emergency Use Authorization (EUA). This EUA will remain  in effect (meaning this test can be used) for the duration of the COVID-19 declaration under Section 564(b)(1) of the Act, 21 U.S.C.section 360bbb-3(b)(1), unless the authorization is terminated  or revoked sooner.       Influenza A by PCR NEGATIVE NEGATIVE Final   Influenza B by PCR NEGATIVE NEGATIVE Final    Comment: (NOTE) The Xpert Xpress SARS-CoV-2/FLU/RSV plus assay is intended as an aid in the diagnosis of influenza from  Nasopharyngeal swab specimens and should not be used as a sole basis for treatment. Nasal washings and aspirates are unacceptable for Xpert Xpress SARS-CoV-2/FLU/RSV testing.  Fact Sheet for Patients: EntrepreneurPulse.com.au  Fact Sheet for Healthcare Providers: IncredibleEmployment.be  This test is not yet approved or cleared by the [place] FDA and has been authorized for detection and/or diagnosis of SARS-CoV-2 by FDA under an Emergency Use Authorization (EUA). This EUA will remain in effect (meaning this test can be used) for the duration of the COVID-19 declaration under Section 564(b)(1) of the Act, 21 U.S.C. section 360bbb-3(b)(1), unless the authorization is terminated or revoked.  Performed at [facility], [address], [place], [place] [address]     Radiology Studies: HYBRID OR IMAGING ([place])  Result Date: 09/14/2021 There is no interpretation for this exam.  This order is for images obtained during a surgical procedure.  Please See "Surgeries" Tab for more information regarding the procedure.      Zury Fazzino T. [place]  If 7PM-7AM, please contact night-coverage [email or web address] 09/14/2021, 5:23 PM

## 2021-09-14 NOTE — Anesthesia Procedure Notes (Signed)
Procedure Name: Intubation Date/Time: 09/14/2021 1:44 PM Performed by: Clearnce Sorrel, CRNA Pre-anesthesia Checklist: Patient identified, Emergency Drugs available, Suction available and Patient being monitored Patient Re-evaluated:Patient Re-evaluated prior to induction Oxygen Delivery Method: Circle System Utilized Preoxygenation: Pre-oxygenation with 100% oxygen Induction Type: IV induction Ventilation: Mask ventilation without difficulty Laryngoscope Size: Mac and 3 Grade View: Grade II Tube type: Oral Tube size: 7.0 mm Number of attempts: 1 Airway Equipment and Method: Stylet and Oral airway Placement Confirmation: ETT inserted through vocal cords under direct vision, positive ETCO2 and breath sounds checked- equal and bilateral Secured at: 23 cm Tube secured with: Tape Dental Injury: Teeth and Oropharynx as per pre-operative assessment

## 2021-09-14 NOTE — Progress Notes (Signed)
ANTICOAGULATION CONSULT NOTE  Pharmacy Consult for heparin Indication:  IVC thrombus  Allergies  Allergen Reactions   Citric Acid     migraines   Codeine Nausea And Vomiting    Headache & shakes   Other Nausea And Vomiting    MSG - migraines   Statins     Muscle weakness, tolerates low doses of crestor   Tape Hives   Epinephrine Palpitations   Sulfa Antibiotics Nausea And Vomiting and Rash    Patient Measurements: Height: 5\' 3"  (160 cm) Weight: 70.3 kg (155 lb) IBW/kg (Calculated) : 52.4  Vital Signs: Temp: 97.5 F (36.4 C) (01/09 1844) Temp Source: Axillary (01/09 1844) BP: 133/85 (01/09 1844) Pulse Rate: 98 (01/09 1844)  Labs: Recent Labs    09/11/21 1940 09/12/21 0913 09/12/21 1109 09/12/21 1622 09/13/21 0423 09/14/21 0403  HGB 13.8  --  12.9  --  12.1 11.2*  HCT 41.5  --  37.5  --  35.7* 33.8*  PLT 266  --  229  --  207 213  HEPARINUNFRC  --    < >  --  0.60 0.57 0.47  CREATININE 0.77  --  0.73  --   --   --    < > = values in this interval not displayed.     Estimated Creatinine Clearance: 62.4 mL/min (by C-G formula based on SCr of 0.73 mg/dL).  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  Heparin level is at goal at 0.47 on 1100 units/hr. No bleeding noted. CBC stable.  Patient now back from OR with successful recanalization of the inferior vena cava using suction thrombectomy. Heparin to resume at prior rate. Has been at goal, will recheck in am.   Goal of Therapy:  Heparin level 0.3-0.7 units/ml Monitor platelets by anticoagulation protocol: Yes   Plan:  Restart heparin infusion at 1100 units/hr Monitor heparin levels and CBC daily Monitor for s/sx of bleeding  Thank you for involving pharmacy in this patient's care.  Erin Hearing PharmD., BCPS Clinical Pharmacist 09/14/2021 7:01 PM

## 2021-09-14 NOTE — Progress Notes (Signed)
Pt returned to unit from recovery. Pt lying in bed awake A&O x4. Pain 4/10 to right groin site. VS documented. Heparin gtt cont at 11 ml/hr. Bilat groin venous groin sites level 0 bilat dorsalis pedis pulses +2. Pt to remain on bedrest and flat until 2045. Foley care provided. Call bell within reach.

## 2021-09-14 NOTE — Anesthesia Preprocedure Evaluation (Addendum)
Anesthesia Evaluation  Patient identified by MRN, date of birth, ID band Patient awake    Reviewed: Allergy & Precautions, NPO status , Patient's Chart, lab work & pertinent test results  Airway Mallampati: II  TM Distance: >3 FB     Dental   Pulmonary former smoker,    breath sounds clear to auscultation       Cardiovascular  Rhythm:Regular Rate:Normal  History noted Dr, Nyoka Cowden   Neuro/Psych    GI/Hepatic negative GI ROS, Neg liver ROS,   Endo/Other  Hypothyroidism   Renal/GU negative Renal ROS     Musculoskeletal   Abdominal   Peds  Hematology   Anesthesia Other Findings   Reproductive/Obstetrics                             Anesthesia Physical Anesthesia Plan  ASA: 3  Anesthesia Plan: General   Post-op Pain Management:    Induction: Intravenous  PONV Risk Score and Plan: 3 and Ondansetron  Airway Management Planned: Oral ETT  Additional Equipment: Arterial line  Intra-op Plan:   Post-operative Plan: Possible Post-op intubation/ventilation  Informed Consent: I have reviewed the patients History and Physical, chart, labs and discussed the procedure including the risks, benefits and alternatives for the proposed anesthesia with the patient or authorized representative who has indicated his/her understanding and acceptance.     Dental advisory given  Plan Discussed with: CRNA, Anesthesiologist and Surgeon  Anesthesia Plan Comments:        Anesthesia Quick Evaluation

## 2021-09-14 NOTE — Progress Notes (Signed)
Pt off unit to OR

## 2021-09-15 ENCOUNTER — Inpatient Hospital Stay (HOSPITAL_COMMUNITY): Payer: Medicare Other

## 2021-09-15 ENCOUNTER — Encounter (HOSPITAL_COMMUNITY): Payer: Self-pay | Admitting: Vascular Surgery

## 2021-09-15 DIAGNOSIS — D62 Acute posthemorrhagic anemia: Secondary | ICD-10-CM

## 2021-09-15 DIAGNOSIS — I8290 Acute embolism and thrombosis of unspecified vein: Secondary | ICD-10-CM

## 2021-09-15 LAB — HEPARIN LEVEL (UNFRACTIONATED): Heparin Unfractionated: 0.9 IU/mL — ABNORMAL HIGH (ref 0.30–0.70)

## 2021-09-15 LAB — CBC
HCT: 28.8 % — ABNORMAL LOW (ref 36.0–46.0)
Hemoglobin: 9.7 g/dL — ABNORMAL LOW (ref 12.0–15.0)
MCH: 29.3 pg (ref 26.0–34.0)
MCHC: 33.7 g/dL (ref 30.0–36.0)
MCV: 87 fL (ref 80.0–100.0)
Platelets: 240 10*3/uL (ref 150–400)
RBC: 3.31 MIL/uL — ABNORMAL LOW (ref 3.87–5.11)
RDW: 12.6 % (ref 11.5–15.5)
WBC: 8.8 10*3/uL (ref 4.0–10.5)
nRBC: 0 % (ref 0.0–0.2)

## 2021-09-15 MED ORDER — APIXABAN 5 MG PO TABS
10.0000 mg | ORAL_TABLET | Freq: Two times a day (BID) | ORAL | Status: DC
  Administered 2021-09-15: 10 mg via ORAL
  Filled 2021-09-15: qty 2

## 2021-09-15 MED ORDER — APIXABAN 5 MG PO TABS: 5.0000 mg | ORAL_TABLET | Freq: Two times a day (BID) | ORAL | Status: DC

## 2021-09-15 MED ORDER — APIXABAN (ELIQUIS) VTE STARTER PACK (10MG AND 5MG): ORAL_TABLET | ORAL | 0 refills | Status: DC

## 2021-09-15 MED ORDER — APIXABAN 5 MG PO TABS: 5.0000 mg | ORAL_TABLET | Freq: Two times a day (BID) | ORAL | 0 refills | Status: DC

## 2021-09-15 NOTE — Progress Notes (Addendum)
ANTICOAGULATION CONSULT NOTE - Follow Up Consult  Pharmacy Consult for heparin Indication:  IVC thrombus  Labs: Recent Labs    09/12/21 1109 09/12/21 1622 09/13/21 0423 09/14/21 0403 09/14/21 1740 09/15/21 0403  HGB 12.9  --  12.1 11.2* 10.5* 9.7*  HCT 37.5  --  35.7* 33.8* 31.3* 28.8*  PLT 229  --  207 213  --  240  HEPARINUNFRC  --    < > 0.57 0.47  --  0.90*  CREATININE 0.73  --   --   --   --   --    < > = values in this interval not displayed.    Assessment: 71yo female supratherapeutic on heparin after several levels at goal; no infusion issues or signs of bleeding per RN.  Goal of Therapy:  Heparin level 0.3-0.7 units/ml   Plan:  Will decrease heparin infusion by 2 units/kg/hr to 950 units/hr and check level in 6-8 hours.    Wynona Neat, PharmD, BCPS  09/15/2021,6:07 AM

## 2021-09-15 NOTE — Discharge Instructions (Signed)
Information on my medicine - ELIQUIS (apixaban)  This medication education was reviewed with me or my healthcare representative as part of my discharge preparation.   Why was Eliquis prescribed for you? Eliquis was prescribed to treat blood clots that may have been found in the veins of your legs (deep vein thrombosis) or in your lungs (pulmonary embolism) and to reduce the risk of them occurring again.  What do You need to know about Eliquis ? The starting dose is 10 mg (two 5 mg tablets) taken TWICE daily for the FIRST SEVEN (7) DAYS, then on (enter date)  1/17  the dose is reduced to ONE 5 mg tablet taken TWICE daily.  Eliquis may be taken with or without food.   Try to take the dose about the same time in the morning and in the evening. If you have difficulty swallowing the tablet whole please discuss with your pharmacist how to take the medication safely.  Take Eliquis exactly as prescribed and DO NOT stop taking Eliquis without talking to the doctor who prescribed the medication.  Stopping may increase your risk of developing a new blood clot.  Refill your prescription before you run out.  After discharge, you should have regular check-up appointments with your healthcare provider that is prescribing your Eliquis.    What do you do if you miss a dose? If a dose of ELIQUIS is not taken at the scheduled time, take it as soon as possible on the same day and twice-daily administration should be resumed. The dose should not be doubled to make up for a missed dose.  Important Safety Information A possible side effect of Eliquis is bleeding. You should call your healthcare provider right away if you experience any of the following: Bleeding from an injury or your nose that does not stop. Unusual colored urine (red or dark brown) or unusual colored stools (red or black). Unusual bruising for unknown reasons. A serious fall or if you hit your head (even if there is no  bleeding).  Some medicines may interact with Eliquis and might increase your risk of bleeding or clotting while on Eliquis. To help avoid this, consult your healthcare provider or pharmacist prior to using any new prescription or non-prescription medications, including herbals, vitamins, non-steroidal anti-inflammatory drugs (NSAIDs) and supplements.  This website has more information on Eliquis (apixaban): http://www.eliquis.com/eliquis/home

## 2021-09-15 NOTE — Discharge Summary (Signed)
Physician Discharge Summary  Nancy Anthony OIZ:[id number] DOB: 12-30-1950 DOA: 09/11/2021  PCP: Harlan Stains, MD  Admit date: 09/11/2021 Discharge date: 09/15/2021 Admitted From: Home Disposition: Home Recommendations for Outpatient Follow-up:  Follow ups as below. Please obtain CBC/BMP/Mag at follow up Please follow up on the following pending results: None Home Health: Not indicated Equipment/Devices: Not indicated Discharge Condition: Stable CODE STATUS: Full code  Follow-up Information     Broadus John, MD. Call in 1 month(s).   Specialty: Vascular Surgery Contact information: [address] [place] [address] [phone]         Harlan Stains, MD. Schedule an appointment as soon as possible for a visit in 1 week(s).   Specialty: Family Medicine Contact information: [address] [place] [address] [phone]                Hospital Course: 71 year old F with PMH of hypothyroidism, hyperlipidemia, anemia, cholecystectomy, L2 compression fracture s/p kyphoplasty on 09/03/2021 presenting with increasing epigastric and RUQ abdominal pain and nausea and admitted for IVC thrombosis.  CT abdomen and pelvis showed prior L2 kyphoplasty with cement extending into the right paravertebral veins and into the IVC with thrombosis noted of the infrarenal IVC.  Patient was started on IV heparin.  Vascular surgery and her orthopedic surgeon consulted.   Patient underwent percutaneous suction of thrombectomy of the inferior vena cava and removal of intravenous cement from previous kyphoplasty on 09/14/2021.  Small portion of residual cement lodged into left segmental pulmonary artery branch.  Patient had no postop hemodynamic or respiratory complication.  Abdominal pain basically resolved.  She is cleared for discharge by vascular surgery and orthopedic surgery.  She is discharged on starter pack Eliquis.  Vascular surgery recommended anticoagulation at  least for 6 months.  Outpatient follow-up as above. See individual problem list below for more on hospital course.  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  LE Doppler negative for DVT. -S/p suction thrombectomy and removal of intravenous cement as above -Discharge on starter pack Eliquis. -Advised to avoid NSAIDs while on Eliquis. -Outpatient follow-up as above   L2 compression fracture s/p kyphoplasty on 09/03/2021 by Dr. Lynann Bologna -CT shows cement extension in right paravertebral veins and IVC as above.   -S/p removal of intravenous cement as above -Orthopedic surgery to arrange outpatient follow-up.   SIRS: With leukocytosis and tachycardia.  Likely from dehydration.  UA with ketonuria suggesting dehydration.  No clear source of infection.  She has no respiratory or UTI symptoms.  GI symptoms and SIRS resolved.  Acute postop blood loss anemia: Not symptomatic. Recent Labs    08/27/21 1018 09/11/21 1940 09/12/21 1109 09/13/21 0423 09/14/21 0403 09/14/21 1740 09/15/21 0403  HGB 13.8 13.8 12.9 12.1 11.2* 10.5* 9.7*  -Recheck CBC in 1 week   Hypothyroidism -Continue levothyroxine   Dyslipidemia -Continue atorvastatin   History of migraines -As needed pharmacy substitution of Imitrex   Hyponatremia and hypochloremia: Likely from dehydration.  Resolved.    Body mass index is 27.46 kg/m.           Discharge Exam: Vitals:   09/14/21 2318 09/15/21 0434 09/15/21 0700 09/15/21 0750  BP:  109/64 (!) 152/62   Pulse:    98  Temp: 98.4 F (36.9 C) 97.9 F (36.6 C) 98.5 F (36.9 C)   Resp: 20   14  Height:      Weight:      SpO2:  99%  TempSrc: Oral Oral Oral   BMI (Calculated):         GENERAL: No apparent distress.  Nontoxic. HEENT: MMM.  Vision and hearing grossly intact.  NECK: Supple.  No apparent JVD.  RESP: 99% on RA.  No IWOB.  Fair aeration bilaterally. CVS:  RRR. Heart sounds normal.   ABD/GI/GU: Bowel sounds present. Soft. Non tender.  MSK/EXT:  Moves extremities. No apparent deformity. No edema.  SKIN: no apparent skin lesion or wound NEURO: Awake and alert.  Oriented appropriately.  No apparent focal neuro deficit. PSYCH: Calm. Normal affect.   Discharge Instructions  Discharge Instructions     Diet general   Complete by: As directed    Discharge instructions   Complete by: As directed    It has been a pleasure taking care of you!  You were hospitalized due to blood clot for which you have been treated surgically medically.  We are discharging you on blood thinner (Eliquis).  It is very important that you take this medication as prescribed.  We strongly recommend avoiding any over-the-counter pain medication other than plain Tylenol while taking Eliquis.  Follow and instructions by your vascular and orthopedic surgeon.  Follow-up with your surgeons as recommended to you.  Follow-up with your PCP in 1 to 2 weeks or sooner if needed to have your hemoglobin level rechecked.  Watch for any signs of bleeding such as fresh red blood in stool or dark tarry looking stool while on blood thinner.   Take care,   Increase activity slowly   Complete by: As directed    No wound care   Complete by: As directed       Allergies as of 09/15/2021       Reactions   Citric Acid    migraines   Codeine Nausea And Vomiting   Headache & shakes   Other Nausea And Vomiting   MSG - migraines   Statins    Muscle weakness, tolerates low doses of crestor   Tape Hives   Epinephrine Palpitations   Sulfa Antibiotics Nausea And Vomiting, Rash        Medication List     STOP taking these medications    methocarbamol 500 MG tablet Commonly known as: ROBAXIN   ondansetron 8 MG disintegrating tablet Commonly known as: Zofran ODT   oxyCODONE-acetaminophen 5-325 MG tablet Commonly known as: PERCOCET/ROXICET       TAKE these medications    acetaminophen 500 MG  tablet Commonly known as: TYLENOL Take 1,000 mg by mouth every 6 (six) hours as needed for moderate pain.   Apixaban Starter Pack (10mg  and 5mg ) Commonly known as: ELIQUIS STARTER PACK Take as directed on package: start with two-5mg  tablets twice daily for 7 days. On day 8, switch to one-5mg  tablet twice daily.   apixaban 5 MG Tabs tablet Commonly known as: ELIQUIS Take 1 tablet (5 mg total) by mouth 2 (two) times daily. Start when you finish starter pack Start taking on: October 13, 2021   CALCIUM MAGNESIUM PO Take 1 tablet by mouth daily.   cyclobenzaprine 10 MG tablet Commonly known as: FLEXERIL Take 10 mg by mouth at bedtime.   Folic Acid-Vit Z6-XWR U04 2.5-25-1 MG Tabs tablet Commonly known as: FOLBEE Take 1 tablet by mouth daily.   frovatriptan 2.5 MG tablet Commonly known as: FROVA Take 2.5 mg by mouth as needed for migraine.   nystatin powder Generic drug: nystatin Apply 1 application topically 2 (two) times daily  as needed.   perphenazine 4 MG tablet Commonly known as: TRILAFON Take 2 mg by mouth as needed for migraine. PRN every 2 hours   promethazine 25 MG suppository Commonly known as: PHENERGAN Place 25-50 mg rectally as needed for nausea or vomiting.   rosuvastatin 5 MG tablet Commonly known as: CRESTOR Take 2.5 mg by mouth at bedtime.   Synthroid 88 MCG tablet Generic drug: levothyroxine Take 88 mcg by mouth at bedtime.   Vitamin D 125 MCG (5000 UT) Caps Take 5,000 Units by mouth daily.        Consultations: Vascular surgery Orthopedic surgery  Procedures/Studies: 09/14/2021 Successful recanalization of the inferior vena cava using suction thrombectomy Removal of intravenous cement from previous kyphoplasty. Small portion of residual cement lodged in a left segmental pulmonary branch.   DG THORACOLUMABAR SPINE  Result Date: 09/03/2021 CLINICAL DATA:  Kyphoplasty EXAM: THORACOLUMBAR SPINE 1V COMPARISON:  Lumbar MRI 07/26/2021 FINDINGS:  Two views are submitted. Lumbar compression fracture cement augmentation. Small volume cement at the basivertebral plexus and extending into a ventral vessel. No significant progression of height loss. IMPRESSION: Fluoroscopy for cement augmentation. Electronically Signed   By: Jorje Guild M.D.   On: 09/03/2021 11:11   CT ABDOMEN PELVIS W CONTRAST  Result Date: 09/12/2021 CLINICAL DATA:  Abdominal pain, acute, nonlocalized EXAM: CT ABDOMEN AND PELVIS WITH CONTRAST TECHNIQUE: Multidetector CT imaging of the abdomen and pelvis was performed using the standard protocol following bolus administration of intravenous contrast. CONTRAST:  166mL OMNIPAQUE IOHEXOL 300 MG/ML  SOLN COMPARISON:  None. FINDINGS: Lower chest: Lung bases are clear. No effusions. Heart is normal size. Hepatobiliary: No focal liver abnormality is seen. Status post cholecystectomy. No biliary dilatation. Pancreas: No focal abnormality or ductal dilatation. Spleen: No focal abnormality.  Normal size. Adrenals/Urinary Tract: Right upper pole renal cyst appears benign. No hydronephrosis. Adrenal glands and urinary bladder Stomach/Bowel: Stomach, large and small bowel grossly unremarkable. Vascular/Lymphatic: Aortic calcifications. No aneurysm. Cement from the L2 kyphoplasty extends in right paravertebral veins and into the IVC with thrombus seen around the cement in the IVC and extending inferiorly in the IVC. Reproductive: Uterus and adnexa unremarkable.  No mass. Other: No free fluid or free air. Musculoskeletal: No acute bony abnormality. Kyphoplasty changes noted at L2. IMPRESSION: Prior L2 kyphoplasty. Cement extends in right paravertebral veins and into the IVC with thrombus noted in the infrarenal IVC. These results were called by telephone at the time of interpretation on 09/12/2021 at 12:06 am to provider Dr. Eston Mould, Who verbally acknowledged these results. Electronically Signed   By: Rolm Baptise M.D.   On: 09/12/2021 00:12   DG C-Arm  1-60 Min-No Report  Result Date: 09/03/2021 Fluoroscopy was utilized by the requesting physician.  No radiographic interpretation.   VAS [place] LOWER EXTREMITY VENOUS (DVT)  Result Date: 09/15/2021  Lower Venous DVT Study Patient Name:  Nancy Anthony  Date of Exam:   09/15/2021 Medical Rec #: [id number]        Accession #:    [id number] Date of Birth: November 26, 1950        Patient Gender: F Patient Age:   68 years Exam Location:  [facility] Procedure:      VAS [place] LOWER EXTREMITY VENOUS (DVT) Referring Phys: EMMA COLLINS --------------------------------------------------------------------------------  Indications: Cement from L2 kyphoplasty extending to IVC w/ thrombus on CT abdomen/pelvis. Suction thrombectomy on 09-11-21.  Limitations: Bandages. Comparison Study: No prior studies. Performing Technologist: Darlin Coco RDMS, RVT  Examination Guidelines: A complete evaluation includes  B-mode imaging, spectral Doppler, color Doppler, and power Doppler as needed of all accessible portions of each vessel. Bilateral testing is considered an integral part of a complete examination. Limited examinations for reoccurring indications may be performed as noted. The reflux portion of the exam is performed with the patient in reverse Trendelenburg.  +---------+---------------+---------+-----------+----------+-------------------+  RIGHT     Compressibility Phasicity Spontaneity Properties Thrombus Aging       +---------+---------------+---------+-----------+----------+-------------------+  CFV                       Yes       Yes                    Unable to compress                                                               due to                                                                           bandaging/pain       +---------+---------------+---------+-----------+----------+-------------------+  SFJ       Full                                                                   +---------+---------------+---------+-----------+----------+-------------------+  FV Prox   Full                                                                  +---------+---------------+---------+-----------+----------+-------------------+  FV Mid    Full                                                                  +---------+---------------+---------+-----------+----------+-------------------+  FV Distal Full                                                                  +---------+---------------+---------+-----------+----------+-------------------+  PFV       Full                                                                  +---------+---------------+---------+-----------+----------+-------------------+  POP       Full            Yes       Yes                                         +---------+---------------+---------+-----------+----------+-------------------+  PTV       Full                                                                  +---------+---------------+---------+-----------+----------+-------------------+  PERO      Full                                                                  +---------+---------------+---------+-----------+----------+-------------------+  Gastroc   Full                                                                  +---------+---------------+---------+-----------+----------+-------------------+   +---------+---------------+---------+-----------+----------+--------------+  LEFT      Compressibility Phasicity Spontaneity Properties Thrombus Aging  +---------+---------------+---------+-----------+----------+--------------+  CFV       Full            Yes       Yes                                    +---------+---------------+---------+-----------+----------+--------------+  SFJ       Full                                                             +---------+---------------+---------+-----------+----------+--------------+  FV Prox   Full                                                              +---------+---------------+---------+-----------+----------+--------------+  FV Mid    Full                                                             +---------+---------------+---------+-----------+----------+--------------+  FV Distal Full                                                             +---------+---------------+---------+-----------+----------+--------------+  PFV       Full                                                             +---------+---------------+---------+-----------+----------+--------------+  POP       Full            Yes       Yes                                    +---------+---------------+---------+-----------+----------+--------------+  PTV       Full                                                             +---------+---------------+---------+-----------+----------+--------------+  PERO      Full                                                             +---------+---------------+---------+-----------+----------+--------------+  Gastroc   Full                                                             +---------+---------------+---------+-----------+----------+--------------+    Summary: RIGHT: - There is no evidence of deep vein thrombosis in the lower extremity. However, portions of this examination were limited- see technologist comments above.  - No cystic structure found in the popliteal fossa.  LEFT: - There is no evidence of deep vein thrombosis in the lower extremity.  - No cystic structure found in the popliteal fossa.  *See table(s) above for measurements and observations.    Preliminary    HYBRID OR IMAGING ([facility] ONLY)  Result Date: 09/14/2021 There is no interpretation for this exam.  This order is for images obtained during a surgical procedure.  Please See "Surgeries" Tab for more information regarding the procedure.       The results of significant diagnostics from this hospitalization (including imaging,  microbiology, ancillary and laboratory) are listed below for reference.     Microbiology: Recent Results (from the past 240 hour(s))  Resp Panel by RT-PCR (Flu A&B, Covid) Nasopharyngeal Swab     Status: None   Collection Time: 09/12/21  1:47 AM   Specimen: Nasopharyngeal Swab; Nasopharyngeal(NP) swabs in vial transport medium  Result Value Ref Range Status   SARS Coronavirus 2 by RT PCR NEGATIVE NEGATIVE Final    Comment: (NOTE) SARS-CoV-2 target nucleic acids are NOT DETECTED.  The SARS-CoV-2 RNA is generally detectable in upper respiratory specimens during the acute phase of infection. The lowest concentration of SARS-CoV-2 viral copies this assay can detect is 138 copies/mL. A negative result does not preclude SARS-Cov-2 infection and should not be  used as the sole basis for treatment or other patient management decisions. A negative result may occur with  improper specimen collection/handling, submission of specimen other than nasopharyngeal swab, presence of viral mutation(s) within the areas targeted by this assay, and inadequate number of viral copies(<138 copies/mL). A negative result must be combined with clinical observations, patient history, and epidemiological information. The expected result is Negative.  Fact Sheet for Patients:  EntrepreneurPulse.com.au  Fact Sheet for Healthcare Providers:  IncredibleEmployment.be  This test is no t yet approved or cleared by the [place] FDA and  has been authorized for detection and/or diagnosis of SARS-CoV-2 by FDA under an Emergency Use Authorization (EUA). This EUA will remain  in effect (meaning this test can be used) for the duration of the COVID-19 declaration under Section 564(b)(1) of the Act, 21 U.S.C.section 360bbb-3(b)(1), unless the authorization is terminated  or revoked sooner.       Influenza A by PCR NEGATIVE NEGATIVE Final   Influenza B by PCR NEGATIVE NEGATIVE  Final    Comment: (NOTE) The Xpert Xpress SARS-CoV-2/FLU/RSV plus assay is intended as an aid in the diagnosis of influenza from Nasopharyngeal swab specimens and should not be used as a sole basis for treatment. Nasal washings and aspirates are unacceptable for Xpert Xpress SARS-CoV-2/FLU/RSV testing.  Fact Sheet for Patients: EntrepreneurPulse.com.au  Fact Sheet for Healthcare Providers: IncredibleEmployment.be  This test is not yet approved or cleared by the [place] FDA and has been authorized for detection and/or diagnosis of SARS-CoV-2 by FDA under an Emergency Use Authorization (EUA). This EUA will remain in effect (meaning this test can be used) for the duration of the COVID-19 declaration under Section 564(b)(1) of the Act, 21 U.S.C. section 360bbb-3(b)(1), unless the authorization is terminated or revoked.  Performed at [facility], [place], [place], [place] [address]      Labs:  CBC: Recent Labs  Lab 09/11/21 1940 09/12/21 1109 09/13/21 0423 09/14/21 0403 09/14/21 1740 09/15/21 0403  WBC 12.5* 8.7 7.1 5.9  --  8.8  HGB 13.8 12.9 12.1 11.2* 10.5* 9.7*  HCT 41.5 37.5 35.7* 33.8* 31.3* 28.8*  MCV 88.5 88.9 89.3 88.7  --  87.0  PLT 266 229 207 213  --  240   BMP &GFR Recent Labs  Lab 09/11/21 1940 09/12/21 1109  NA 133* 139  K 4.5 4.2  CL 96* 106  CO2 25 21*  GLUCOSE 104* 97  BUN 13 7*  CREATININE 0.77 0.73  CALCIUM 9.6 8.7*   Estimated Creatinine Clearance: 62.4 mL/min (by C-G formula based on SCr of 0.73 mg/dL). Liver & Pancreas: Recent Labs  Lab 09/11/21 1940  AST 12*  ALT 8  ALKPHOS 91  BILITOT 1.1  PROT 7.4  ALBUMIN 4.2   Recent Labs  Lab 09/11/21 1940  LIPASE 14   No results for input(s): AMMONIA in the last 168 hours. Diabetic: No results for input(s): HGBA1C in the last 72 hours. No results for input(s): GLUCAP in the last 168 hours. Cardiac Enzymes: No  results for input(s): CKTOTAL, CKMB, CKMBINDEX, TROPONINI in the last 168 hours. No results for input(s): PROBNP in the last 8760 hours. Coagulation Profile: No results for input(s): INR, PROTIME in the last 168 hours. Thyroid Function Tests: No results for input(s): TSH, T4TOTAL, FREET4, T3FREE, THYROIDAB in the last 72 hours. Lipid Profile: No results for input(s): CHOL, HDL, LDLCALC, TRIG, CHOLHDL, LDLDIRECT in the last 72 hours. Anemia Panel: No results for input(s): VITAMINB12, FOLATE, FERRITIN,  TIBC, IRON, RETICCTPCT in the last 72 hours. Urine analysis:    Component Value Date/Time   COLORURINE YELLOW 09/11/2021 2311   APPEARANCEUR CLEAR 09/11/2021 2311   LABSPEC 1.010 09/11/2021 2311   PHURINE 6.0 09/11/2021 2311   GLUCOSEU NEGATIVE 09/11/2021 2311   HGBUR SMALL (A) 09/11/2021 2311   BILIRUBINUR NEGATIVE 09/11/2021 2311   KETONESUR 40 (A) 09/11/2021 2311   PROTEINUR NEGATIVE 09/11/2021 2311   NITRITE NEGATIVE 09/11/2021 2311   LEUKOCYTESUR MODERATE (A) 09/11/2021 2311   Sepsis Labs: Invalid input(s): PROCALCITONIN, LACTICIDVEN   Time coordinating discharge: 45 minutes  SIGNED:  Mercy Riding, MD  [facility] 09/15/2021, 5:14 PM

## 2021-09-15 NOTE — Evaluation (Signed)
Physical Therapy Evaluation and Discharge Patient Details Name: Nancy Anthony MRN: [id number] DOB: June 16, 1951 Today's Date: 09/15/2021  History of Present Illness  Pt is a 71 y/o female admitted 1/8 secondary to abdominal pain. 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 Pt with recent L2 kyphoplasty. PMH includes migraines.  Clinical Impression  Patient evaluated by Physical Therapy with no further acute PT needs identified. All education has been completed and the patient has no further questions. Pt overall steady with mobility tasks. Able to perform at a mod I to supervision level with no LOB. Educated about walking program to perform at home. Reports sister will be staying with her if needed. See below for any follow-up Physical Therapy or equipment needs. PT is signing off. Thank you for this referral. If needs change, please re-consult.         Recommendations for follow up therapy are one component of a multi-disciplinary discharge planning process, led by the attending physician.  Recommendations may be updated based on patient status, additional functional criteria and insurance authorization.  Follow Up Recommendations No PT follow up    Assistance Recommended at Discharge Intermittent Supervision/Assistance  Patient can return home with the following       Equipment Recommendations None recommended by PT  Recommendations for Other Services       Functional Status Assessment Patient has had a recent decline in their functional status and demonstrates the ability to make significant improvements in function in a reasonable and predictable amount of time.     Precautions / Restrictions Precautions Precautions: Fall Restrictions Weight Bearing Restrictions: No      Mobility  Bed Mobility               General bed mobility comments: in recliner upon entry    Transfers Overall transfer level: Modified independent                       Ambulation/Gait Ambulation/Gait assistance: Supervision Gait Distance (Feet): 125 Feet Assistive device: IV Pole Gait Pattern/deviations: Step-through pattern;Decreased stride length Gait velocity: Decreased     General Gait Details: Supervision for safety. No LOB noted. Mild fatigue, but did not affect mobility. Educated about walking program to perform at home.  Stairs            Wheelchair Mobility    Modified Rankin (Stroke Patients Only)       Balance Overall balance assessment: No apparent balance deficits (not formally assessed)                                           Pertinent Vitals/Pain Pain Assessment: Faces Faces Pain Scale: Hurts little more Pain Location: back and R groin Pain Descriptors / Indicators: Guarding;Grimacing Pain Intervention(s): Limited activity within patient's tolerance;Monitored during session;Repositioned    Home Living Family/patient expects to be discharged to:: Private residence Living Arrangements: Alone Available Help at Discharge: Family;Available 24 hours/day Type of Home: House Home Access: Other (comment) (threshold)       Home Layout: One level Home Equipment: Shower seat - built in      Prior Function Prior Level of Function : Independent/Modified Independent                     Hand Dominance        Extremity/Trunk Assessment   Upper  Extremity Assessment Upper Extremity Assessment: Overall WFL for tasks assessed    Lower Extremity Assessment Lower Extremity Assessment: Overall WFL for tasks assessed    Cervical / Trunk Assessment Cervical / Trunk Assessment: Other exceptions Cervical / Trunk Exceptions: recent kyphoplasty  Communication   Communication: No difficulties  Cognition Arousal/Alertness: Awake/alert Behavior During Therapy: WFL for tasks assessed/performed Overall Cognitive Status: Within Functional Limits for tasks assessed                                           General Comments General comments (skin integrity, edema, etc.): Pt's daughter present during session.    Exercises     Assessment/Plan    PT Assessment Patient does not need any further PT services  PT Problem List         PT Treatment Interventions      PT Goals (Current goals can be found in the Care Plan section)  Acute Rehab PT Goals Patient Stated Goal: to go home PT Goal Formulation: With patient Time For Goal Achievement: 09/15/21 Potential to Achieve Goals: Good    Frequency       Co-evaluation               AM-PAC PT "6 Clicks" Mobility  Outcome Measure Help needed turning from your back to your side while in a flat bed without using bedrails?: None Help needed moving from lying on your back to sitting on the side of a flat bed without using bedrails?: None Help needed moving to and from a bed to a chair (including a wheelchair)?: None Help needed standing up from a chair using your arms (e.g., wheelchair or bedside chair)?: None Help needed to walk in hospital room?: None Help needed climbing 3-5 steps with a railing? : A Little 6 Click Score: 23    End of Session   Activity Tolerance: Patient tolerated treatment well Patient left: in chair;with call bell/phone within reach;with family/visitor present Nurse Communication: Mobility status PT Visit Diagnosis: Other abnormalities of gait and mobility (R26.89)    Time: 6160-7371 PT Time Calculation (min) (ACUTE ONLY): 16 min   Charges:   PT Evaluation $PT Eval Low Complexity: 1 Low          [name], DPT  Acute Rehabilitation Services  Pager: [phone] Office: [phone]   [name] 09/15/2021, 10:46 AM

## 2021-09-15 NOTE — Progress Notes (Signed)
Lower extremity venous bilateral study completed.  Preliminary results relayed to [place], MD.   See CV Proc for preliminary results report.   Mcclain Shall, RDMS, RVT  

## 2021-09-15 NOTE — Progress Notes (Addendum)
°  Progress Note    09/15/2021 8:31 AM 1 Day Post-Op  Subjective:  feeling much better today.  Denies BLE edema   Vitals:   09/15/21 0434 09/15/21 0700  BP: 109/64 (!) 152/62  Pulse:    Resp:    Temp: 97.9 F (36.6 C) 98.5 F (36.9 C)  SpO2:     Physical Exam: Lungs:  non labored Incisions:  groin cath sites without hematoma Extremities:  feet warm with palpable DP pulses; no significant edema BLE Neurologic: A&O  CBC    Component Value Date/Time   WBC 8.8 09/15/2021 0403   RBC 3.31 (L) 09/15/2021 0403   HGB 9.7 (L) 09/15/2021 0403   HCT 28.8 (L) 09/15/2021 0403   PLT 240 09/15/2021 0403   MCV 87.0 09/15/2021 0403   MCH 29.3 09/15/2021 0403   MCHC 33.7 09/15/2021 0403   RDW 12.6 09/15/2021 0403   LYMPHSABS 1.2 08/27/2021 1018   MONOABS 0.6 08/27/2021 1018   EOSABS 0.2 08/27/2021 1018   BASOSABS 0.1 08/27/2021 1018    BMET    Component Value Date/Time   NA 139 09/12/2021 1109   K 4.2 09/12/2021 1109   CL 106 09/12/2021 1109   CO2 21 (L) 09/12/2021 1109   GLUCOSE 97 09/12/2021 1109   BUN 7 (L) 09/12/2021 1109   CREATININE 0.73 09/12/2021 1109   CALCIUM 8.7 (L) 09/12/2021 1109   GFRNONAA >60 09/12/2021 1109   GFRAA >60 10/29/2017 0733    INR    Component Value Date/Time   INR 0.9 08/27/2021 1018     Intake/Output Summary (Last 24 hours) at 09/15/2021 0831 Last data filed at 09/15/2021 0750 Gross per 24 hour  Intake 1490 ml  Output 1975 ml  Net -485 ml     Assessment/Plan:  71 y.o. female is s/p suction thrombectomy of IVC and foreign body removal 1 Day Post-Op   Patient is feeling well this morning with minimal abd pain.  She does not notice much swelling of BLE.  Common femoral vein access sites are without hematoma.  PT has been ordered for this morning.  If ambulating ok, she can be discharged home today.  She will be transitioned from heparin to a DOAC.  Our office will schedule a iliac/caval duplex in 1 month.  She will need at least 6  months of anticoagulation, possibly lifelong.   Dagoberto Ligas, PA-C [facility] [phone] 09/15/2021 8:31 AM  VASCULAR STAFF ADDENDUM: I have independently interviewed and examined the patient. I agree with the above.    Cassandria Santee, MD [facility] Phone Number: [phone] 09/15/2021 11:45 AM

## 2021-09-15 NOTE — Progress Notes (Addendum)
Pt with d/c orders to home. IV removed. Assessment complete pt stable. Discharge eduction provided all questions answered. Prescriptions sent to pharmacy. Per MD order pt monitored for post Eliquis dose. Pt without any reaction or s/s of bleeding. Pt and all belongings transported to private vehicle without incident.

## 2021-09-15 NOTE — Care Management Important Message (Signed)
Important Message  Patient Details  Name: Nancy Anthony MRN: [id number] Date of Birth: [birth year]   Medicare Important Message Given:  Yes     Hannah Beat 09/15/2021, 1:22 PM

## 2021-09-15 NOTE — Progress Notes (Signed)
° ° ° °  I have spoken with Dr. Virl Cagey immediately following Ms. Bosch's procedure. He notes a successful thrombectomy with now a patent IVC and expects no sequale going forward. Patient is comfortable today with resolved abdominal pain.  She has been up and ambulating.    Physical Exam: Vitals:   09/14/21 2318 09/15/21 0434  BP:  109/64  Pulse:    Resp: 20   Temp: 98.4 F (36.9 C) 97.9 F (36.6 C)  SpO2:     Patient appears comfortable Dressing in place NVI  Hg this AM: 9.7  Pt s/p thrombectomy, doing well. Patient continues to report resolved low back pain from her kyphoplasty and now resolved abdominal pain following her thrombectomy procedure.   - Patient looks excellent and per Dr. Virl Cagey, will likely be discharged home today.  - I was very pleased to hear that Dr. Virl Cagey' procedure was successful and that he anticipates full recovery with no adverse sequale. I greatly appreciate his assistance in Nancy Anthony's care - Ms. Valadez's sutures were removed. I will have my office call her for a f/u appointment in about a month.

## 2021-09-15 NOTE — Progress Notes (Signed)
ANTICOAGULATION CONSULT NOTE  Pharmacy Consult:  Heparin > Eliquis Indication:  IVC thrombus  Allergies  Allergen Reactions   Citric Acid     migraines   Codeine Nausea And Vomiting    Headache & shakes   Other Nausea And Vomiting    MSG - migraines   Statins     Muscle weakness, tolerates low doses of crestor   Tape Hives   Epinephrine Palpitations   Sulfa Antibiotics Nausea And Vomiting and Rash    Patient Measurements: Height: 5\' 3"  (160 cm) Weight: 70.3 kg (155 lb) IBW/kg (Calculated) : 52.4  Vital Signs: Temp: 98.5 F (36.9 C) (01/10 0700) Temp Source: Oral (01/10 0700) BP: 152/62 (01/10 0700) Pulse Rate: 98 (01/10 0750)  Labs: Recent Labs    09/13/21 0423 09/14/21 0403 09/14/21 1740 09/15/21 0403  HGB 12.1 11.2* 10.5* 9.7*  HCT 35.7* 33.8* 31.3* 28.8*  PLT 207 213  --  240  HEPARINUNFRC 0.57 0.47  --  0.90*     Estimated Creatinine Clearance: 62.4 mL/min (by C-G formula based on SCr of 0.73 mg/dL).  Assessment: 71yo female c/o abdominal pain x5d, CT reveals thrombus in infrarenal IVC and patient was started on IV heparin.  Patient is s/p successful recanalization of the inferior vena cava using suction thrombectomy on 1/6.  Pharmacy now consulted to transition patient to Eliquis.  OK to load per discussion with MD.  Goal of Therapy:  Appropriate anticoagulation Monitor platelets by anticoagulation protocol: Yes   Plan:  Stop IV heparin when first dose of Eliquis is administered Eliquis 10mg  PO BID x 7 days, then 5mg  PO BID Pharmacy will sign off and follow peripherally.  Thank you for the consult!  Sagrario Lineberry D. Mina Marble, PharmD, BCPS, [place] 09/15/2021, 11:48 AM

## 2021-09-15 NOTE — Anesthesia Postprocedure Evaluation (Signed)
Anesthesia Post Note  Patient: Nancy Anthony  Procedure(s) Performed: PERCUTANEOUS INFERIOR VENA CAVA SUCTION THROMBECTOMY (Bilateral: Groin) ULTRASOUND GUIDANCE FOR VASCULAR ACCESS (Bilateral: Groin) THROMBECTOMY PULMONARY ARTERY (Right: Groin)     Patient location during evaluation: PACU Anesthesia Type: General Level of consciousness: awake and alert Pain management: pain level controlled Vital Signs Assessment: post-procedure vital signs reviewed and stable Respiratory status: spontaneous breathing, nonlabored ventilation and respiratory function stable Cardiovascular status: blood pressure returned to baseline and stable Postop Assessment: no apparent nausea or vomiting Anesthetic complications: no   No notable events documented.  Last Vitals:  Vitals:   09/15/21 0434 09/15/21 0700  BP: 109/64 (!) 152/62  Pulse:    Resp:    Temp: 36.6 C 36.9 C  SpO2:      Last Pain:  Vitals:   09/15/21 0700  TempSrc: Oral  PainSc:                  Lynda Rainwater

## 2021-09-16 LAB — POCT ACTIVATED CLOTTING TIME
Activated Clotting Time: 275 seconds
Activated Clotting Time: 257 seconds

## 2021-09-17 ENCOUNTER — Telehealth: Payer: Self-pay

## 2021-09-17 NOTE — Telephone Encounter (Signed)
Patient called today concerned about large amount of "blotchy  bruising" & "swishing in ears" since on Eliquis after IVC filter.  Also a rash on back. She has a follow up on 01/17?  Per Dr. Virl Cagey, the bruising is normal for patients on blood thinners.  She should either follow up with PCP or make an appt to see our PA for other symptoms.  Patient aware and verbalized understanding.

## 2021-09-18 ENCOUNTER — Encounter (HOSPITAL_COMMUNITY): Payer: Self-pay | Admitting: Vascular Surgery

## 2021-09-22 ENCOUNTER — Telehealth: Payer: Self-pay | Admitting: *Deleted

## 2021-09-22 ENCOUNTER — Other Ambulatory Visit: Payer: Self-pay

## 2021-09-22 ENCOUNTER — Other Ambulatory Visit: Payer: Self-pay | Admitting: Family Medicine

## 2021-09-22 ENCOUNTER — Ambulatory Visit
Admission: RE | Admit: 2021-09-22 | Discharge: 2021-09-22 | Disposition: A | Discharge status: Home / Self Care | Payer: Medicare Other | Source: Ambulatory Visit | Attending: Family Medicine | Admitting: Family Medicine

## 2021-09-22 DIAGNOSIS — R079 Chest pain, unspecified: Secondary | ICD-10-CM

## 2021-09-22 DIAGNOSIS — E785 Hyperlipidemia, unspecified: Secondary | ICD-10-CM | POA: Diagnosis not present

## 2021-09-22 DIAGNOSIS — Z0000 Encounter for general adult medical examination without abnormal findings: Secondary | ICD-10-CM | POA: Diagnosis not present

## 2021-09-22 DIAGNOSIS — E559 Vitamin D deficiency, unspecified: Secondary | ICD-10-CM | POA: Diagnosis not present

## 2021-09-22 DIAGNOSIS — E039 Hypothyroidism, unspecified: Secondary | ICD-10-CM | POA: Diagnosis not present

## 2021-09-22 DIAGNOSIS — I10 Essential (primary) hypertension: Secondary | ICD-10-CM | POA: Diagnosis not present

## 2021-09-22 DIAGNOSIS — M810 Age-related osteoporosis without current pathological fracture: Secondary | ICD-10-CM | POA: Diagnosis not present

## 2021-09-22 NOTE — Telephone Encounter (Signed)
Dr Dema Severin called with questions regarding patient . She states patient was c/o right sided chest pain and they did chest xray and she wanted to discuss results with Dr Virl Cagey. Dr Virl Cagey not available Dr Stanford Breed called Dr Dema Severin back and answered her questions.

## 2021-09-27 NOTE — Addendum Note (Signed)
Addendum  created 09/27/21 1328 by Freddrick March, MD   Intraprocedure Staff edited

## 2021-09-29 DIAGNOSIS — G43719 Chronic migraine without aura, intractable, without status migrainosus: Secondary | ICD-10-CM | POA: Diagnosis not present

## 2021-09-29 DIAGNOSIS — M791 Myalgia, unspecified site: Secondary | ICD-10-CM | POA: Diagnosis not present

## 2021-09-29 DIAGNOSIS — G518 Other disorders of facial nerve: Secondary | ICD-10-CM | POA: Diagnosis not present

## 2021-09-29 DIAGNOSIS — M542 Cervicalgia: Secondary | ICD-10-CM | POA: Diagnosis not present

## 2021-10-13 ENCOUNTER — Other Ambulatory Visit: Payer: Self-pay

## 2021-10-13 DIAGNOSIS — I82220 Acute embolism and thrombosis of inferior vena cava: Secondary | ICD-10-CM

## 2021-10-14 ENCOUNTER — Ambulatory Visit (INDEPENDENT_AMBULATORY_CARE_PROVIDER_SITE_OTHER): Payer: Medicare Other | Admitting: Pulmonary Disease

## 2021-10-14 ENCOUNTER — Other Ambulatory Visit: Payer: Self-pay

## 2021-10-14 ENCOUNTER — Encounter: Payer: Self-pay | Admitting: Pulmonary Disease

## 2021-10-14 VITALS — BP 142/82 | HR 86 | Temp 98.6°F | Ht 63.0 in | Wt 164.0 lb

## 2021-10-14 DIAGNOSIS — I2693 Single subsegmental pulmonary embolism without acute cor pulmonale: Secondary | ICD-10-CM

## 2021-10-14 DIAGNOSIS — I82220 Acute embolism and thrombosis of inferior vena cava: Secondary | ICD-10-CM | POA: Diagnosis not present

## 2021-10-14 DIAGNOSIS — Z9889 Other specified postprocedural states: Secondary | ICD-10-CM | POA: Diagnosis not present

## 2021-10-14 NOTE — Patient Instructions (Signed)
Thank you for visiting Dr. Valeta Harms at [facility]. Today we recommend the following:  Please continue eliquis Follow up with vascular surgery   Return in about 6 months (around 04/13/2022), or if symptoms worsen or fail to improve, for with APP or Dr. Valeta Harms.    Please do your part to reduce the spread of COVID-19.

## 2021-10-14 NOTE — Progress Notes (Signed)
Synopsis: Referred in February 2023 for pulmonary embolus by Harlan Stains, MD  Subjective:   PATIENT ID: Nancy Anthony GENDER: female DOB: Aug 23, 1951, MRN: [id number]  Chief Complaint  Patient presents with   Consult    This is a 71 year old female, past medical history of hyperlipidemia, hypothyroidism.  Recent fall with vertebral body fracture.  Patient coming to the hospital and had a kyphoplasty.  Kyphoplasty was complicated by injection of methylmethacrylate bone cement into the venous system.  There was partial occlusion of the inferior vena cava as well as embolic disease into the right pulmonary artery.  Patient was seen by vascular surgery with endovascular removable of the cement in the IVC with successful recanalization.  She has follow-up with vascular surgery this Friday.  She was discharged on Eliquis.   Past Medical History:  Diagnosis Date   Anemia    Hashimoto's disease    71 years old   Hyperlipidemia    Hypothyroidism    Migraine    Pneumonia    Ruptured disk    Thyroid disease      Family History  Problem Relation Age of Onset   Hyperthyroidism Mother    Hypothyroidism Father    Migraines Father    Prostate cancer Brother    Hypothyroidism Brother      Past Surgical History:  Procedure Laterality Date   BREAST BIOPSY     BREAST EXCISIONAL BIOPSY Right 25+ yrs ago   benign   BREAST EXCISIONAL BIOPSY Left 25+ yrs ago   benign   CHOLECYSTECTOMY N/A 11/15/2017   Procedure: LAPAROSCOPIC CHOLECYSTECTOMY WITH INTRAOPERATIVE CHOLANGIOGRAM ERAS PATHWAY;  Surgeon: Donnie Mesa, MD;  Location: [place];  Service: General;  Laterality: N/A;   COLONOSCOPY     CYST REMOVAL L EAR Left 1997   DIAGNOSTIC LAPAROSCOPY     KYPHOPLASTY N/A 09/03/2021   Procedure: LUMBAR 2 KYPHOPLASTY;  Surgeon: Phylliss Bob, MD;  Location: [place];  Service: Orthopedics;  Laterality: N/A;   THROMBECTOMY ILIAC ARTERY Right 09/14/2021   Procedure: THROMBECTOMY PULMONARY ARTERY;   Surgeon: Broadus John, MD;  Location: [place];  Service: Vascular;  Laterality: Right;   ULTRASOUND GUIDANCE FOR VASCULAR ACCESS Bilateral 09/14/2021   Procedure: ULTRASOUND GUIDANCE FOR VASCULAR ACCESS;  Surgeon: Broadus John, MD;  Location: [facility] OR;  Service: Vascular;  Laterality: Bilateral;    Social History   Socioeconomic History   Marital status: Widowed    Spouse name: Not on file   Number of children: 1   Years of education: Not on file   Highest education level: Master's degree (e.g., MA, MS, MEng, MEd, MSW, MBA)  Occupational History   Not on file  Tobacco Use   Smoking status: Former    Types: Cigarettes    Quit date: 12/1988    Years since quitting: 32.8   Smokeless tobacco: Never  Vaping Use   Vaping Use: Never used  Substance and Sexual Activity   Alcohol use: No    Comment: quit 1996   Drug use: No   Sexual activity: Yes    Partners: Male  Other Topics Concern   Not on file  Social History Narrative   Regular exercise: no   Caffeine use: 1-2 cups coffee per day    Lives at home alone with her dog   Right handed   Social Determinants of Health   Financial Resource Strain: Not on file  Food Insecurity: Not on file  Transportation Needs: Not on file  Physical Activity:  Not on file  Stress: Not on file  Social Connections: Not on file  Intimate Partner Violence: Not on file     Allergies  Allergen Reactions   Citric Acid     migraines   Codeine Nausea And Vomiting    Headache & shakes   Other Nausea And Vomiting    MSG - migraines   Statins     Muscle weakness, tolerates low doses of crestor   Tape Hives   Epinephrine Palpitations   Sulfa Antibiotics Nausea And Vomiting and Rash     Outpatient Medications Prior to Visit  Medication Sig Dispense Refill   acetaminophen (TYLENOL) 500 MG tablet Take 1,000 mg by mouth every 6 (six) hours as needed for moderate pain.     apixaban (ELIQUIS) 5 MG TABS tablet Take 1 tablet (5 mg total) by mouth 2  (two) times daily. Start when you finish starter pack 180 tablet 0   APIXABAN (ELIQUIS) VTE STARTER PACK (10MG  AND 5MG ) Take as directed on package: start with two-5mg  tablets twice daily for 7 days. On day 8, switch to one-5mg  tablet twice daily. 1 each 0   Calcium-Magnesium-Vitamin D (CALCIUM MAGNESIUM PO) Take 1 tablet by mouth daily.     Cholecalciferol (VITAMIN D) 125 MCG (5000 UT) CAPS Take 5,000 Units by mouth daily.     cyclobenzaprine (FLEXERIL) 10 MG tablet Take 10 mg by mouth at bedtime.     Folic Acid-Vit W0-JWJ X91 (FOLBEE) 2.5-25-1 MG TABS tablet Take 1 tablet by mouth daily.     frovatriptan (FROVA) 2.5 MG tablet Take 2.5 mg by mouth as needed for migraine.     NYSTATIN powder Apply 1 application topically 2 (two) times daily as needed.     perphenazine (TRILAFON) 4 MG tablet Take 2 mg by mouth as needed for migraine. PRN every 2 hours     promethazine (PHENERGAN) 25 MG suppository Place 25-50 mg rectally as needed for nausea or vomiting.      rosuvastatin (CRESTOR) 5 MG tablet Take 2.5 mg by mouth at bedtime.      SYNTHROID 88 MCG tablet Take 88 mcg by mouth at bedtime.      No facility-administered medications prior to visit.    Review of Systems  Constitutional:  Negative for chills, fever, malaise/fatigue and weight loss.  HENT:  Negative for hearing loss, sore throat and tinnitus.   Eyes:  Negative for blurred vision and double vision.  Respiratory:  Negative for cough, hemoptysis, sputum production, shortness of breath, wheezing and stridor.   Cardiovascular:  Negative for chest pain, palpitations, orthopnea, leg swelling and PND.  Gastrointestinal:  Negative for abdominal pain, constipation, diarrhea, heartburn, nausea and vomiting.  Genitourinary:  Negative for dysuria, hematuria and urgency.  Musculoskeletal:  Positive for back pain. Negative for joint pain and myalgias.  Skin:  Negative for itching and rash.  Neurological:  Negative for dizziness, tingling, weakness  and headaches.  Endo/Heme/Allergies:  Negative for environmental allergies. Does not bruise/bleed easily.  Psychiatric/Behavioral:  Negative for depression. The patient is not nervous/anxious and does not have insomnia.   All other systems reviewed and are negative.   Objective:  Physical Exam Vitals reviewed.  Constitutional:      General: She is not in acute distress.    Appearance: She is well-developed.  HENT:     Head: Normocephalic and atraumatic.  Eyes:     General: No scleral icterus.    Conjunctiva/sclera: Conjunctivae normal.     Pupils: Pupils  are equal, round, and reactive to light.  Neck:     Vascular: No JVD.     Trachea: No tracheal deviation.  Cardiovascular:     Rate and Rhythm: Normal rate and regular rhythm.     Heart sounds: Normal heart sounds. No murmur heard. Pulmonary:     Effort: Pulmonary effort is normal. No tachypnea, accessory muscle usage or respiratory distress.     Breath sounds: Normal breath sounds. No stridor. No wheezing, rhonchi or rales.  Abdominal:     General: Bowel sounds are normal. There is no distension.     Palpations: Abdomen is soft.     Tenderness: There is no abdominal tenderness.  Musculoskeletal:        General: No tenderness.     Cervical back: Neck supple.  Lymphadenopathy:     Cervical: No cervical adenopathy.  Skin:    General: Skin is warm and dry.     Capillary Refill: Capillary refill takes less than 2 seconds.     Findings: No rash.  Neurological:     Mental Status: She is alert and oriented to person, place, and time.  Psychiatric:        Behavior: Behavior normal.     Vitals:   10/14/21 1606  BP: (!) 142/82  Pulse: 86  Temp: 98.6 F (37 C)  TempSrc: Oral  SpO2: 99%  Weight: 164 lb (74.4 kg)  Height: 5\' 3"  (1.6 m)   99% on RA BMI Readings from Last 3 Encounters:  10/14/21 29.05 kg/m  09/14/21 27.46 kg/m  09/03/21 27.46 kg/m   Wt Readings from Last 3 Encounters:  10/14/21 164 lb (74.4 kg)   09/14/21 155 lb (70.3 kg)  09/03/21 155 lb (70.3 kg)     CBC    Component Value Date/Time   WBC 8.8 09/15/2021 0403   RBC 3.31 (L) 09/15/2021 0403   HGB 9.7 (L) 09/15/2021 0403   HCT 28.8 (L) 09/15/2021 0403   PLT 240 09/15/2021 0403   MCV 87.0 09/15/2021 0403   MCH 29.3 09/15/2021 0403   MCHC 33.7 09/15/2021 0403   RDW 12.6 09/15/2021 0403   LYMPHSABS 1.2 08/27/2021 1018   MONOABS 0.6 08/27/2021 1018   EOSABS 0.2 08/27/2021 1018   BASOSABS 0.1 08/27/2021 1018    Chest Imaging: Chest x-ray January 2023: Hyperdense material within the right inferior pulmonary artery consistent with bone cement embolization. The patient's images have been independently reviewed by me.    Pulmonary Functions Testing Results: No flowsheet data found.  FeNO:   Pathology:   Echocardiogram:   Heart Catheterization:     Assessment & Plan:     ICD-10-CM   1. 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  Plan: She needs to stay on anticoagulation I think for at least 6 months until she has had endothelialization. Personally I have had 1 other patient with this situation and we were successful at 6 months of anticoagulation and then transition to 81 mg aspirin daily. I do not think that retrieval of the lesion in the pulmonary artery would be an option. At this  point also unsure if this would increase the patient's risk for the development of pulmonary vascular complications such as pulmonary hypertension. We will have to watch her over time.  Recommend follow-up with vascular surgery for IVC duplex.  Return  to clinic in 6 months or as needed.  Could consider CTA chest if patient develops chest pain or shortness of breath as this may serve as a nidus within the pulmonary vascular may increase the patient's risk for the development of pulmonary embolism in situ.   Current Outpatient Medications:    acetaminophen (TYLENOL) 500 MG tablet, Take 1,000 mg by mouth every 6 (six) hours as needed for moderate pain., Disp: , Rfl:    apixaban (ELIQUIS) 5 MG TABS tablet, Take 1 tablet (5 mg total) by mouth 2 (two) times daily. Start when you finish starter pack, Disp: 180 tablet, Rfl: 0   APIXABAN (ELIQUIS) VTE STARTER PACK (10MG  AND 5MG ), Take as directed on package: start with two-5mg  tablets twice daily for 7 days. On day 8, switch to one-5mg  tablet twice daily., Disp: 1 each, Rfl: 0   Calcium-Magnesium-Vitamin D (CALCIUM MAGNESIUM PO), Take 1 tablet by mouth daily., Disp: , Rfl:    Cholecalciferol (VITAMIN D) 125 MCG (5000 UT) CAPS, Take 5,000 Units by mouth daily., Disp: , Rfl:    cyclobenzaprine (FLEXERIL) 10 MG tablet, Take 10 mg by mouth at bedtime., Disp: , Rfl:    Folic Acid-Vit T0-VWP V94 (FOLBEE) 2.5-25-1 MG TABS tablet, Take 1 tablet by mouth daily., Disp: , Rfl:    frovatriptan (FROVA) 2.5 MG tablet, Take 2.5 mg by mouth as needed for migraine., Disp: , Rfl:    NYSTATIN powder, Apply 1 application topically 2 (two) times daily as needed., Disp: , Rfl:    perphenazine (TRILAFON) 4 MG tablet, Take 2 mg by mouth as needed for migraine. PRN every 2 hours, Disp: , Rfl:    promethazine (PHENERGAN) 25 MG suppository, Place 25-50 mg rectally as needed for nausea or vomiting. , Disp: , Rfl:    rosuvastatin (CRESTOR) 5 MG tablet, Take 2.5 mg by mouth at bedtime. , Disp: , Rfl:    SYNTHROID 88 MCG tablet, Take 88 mcg by mouth at bedtime. , Disp: , Rfl:     Garner Nash, DO [facility] 10/14/2021 4:49 PM

## 2021-10-15 NOTE — Progress Notes (Signed)
Office Note    HPI: Nancy Anthony is a 71 y.o. (December 08, 1950) female presenting in follow up s/p 09/14/21 IVC thrombectomy, foreign body removal from migrated kyphoplasty cement. A small portion of cement lodged in a right subsegmental pulmonary artery branch. She tolerated the procedure well and was anticoagulated.   On exam today, Nancy Anthony was doing well.  She denies any shortness of breath, chest pain, back pain.  She has noted some labored breathing when exerting herself, but believes this is due to deconditioning that was present prior to her back surgery.  Abdominal pain present at the time of presentation has resolved. Nancy Anthony denies pain or unilateral leg swelling, but does appreciate some intermittent swelling of the left ankle.  Currently anticoagulated with Eliquis.  Past Medical History:  Diagnosis Date   Anemia    Hashimoto's disease    71 years old   Hyperlipidemia    Hypothyroidism    Migraine    Pneumonia    Ruptured disk    Thyroid disease     Past Surgical History:  Procedure Laterality Date   BREAST BIOPSY     BREAST EXCISIONAL BIOPSY Right 25+ yrs ago   benign   BREAST EXCISIONAL BIOPSY Left 25+ yrs ago   benign   CHOLECYSTECTOMY N/A 11/15/2017   Procedure: LAPAROSCOPIC CHOLECYSTECTOMY WITH INTRAOPERATIVE CHOLANGIOGRAM ERAS PATHWAY;  Surgeon: Nancy Mesa, MD;  Location: [place];  Service: General;  Laterality: N/A;   COLONOSCOPY     CYST REMOVAL L EAR Left 1997   DIAGNOSTIC LAPAROSCOPY     KYPHOPLASTY N/A 09/03/2021   Procedure: LUMBAR 2 KYPHOPLASTY;  Surgeon: Nancy Bob, MD;  Location: [place];  Service: Orthopedics;  Laterality: N/A;   THROMBECTOMY ILIAC ARTERY Right 09/14/2021   Procedure: THROMBECTOMY PULMONARY ARTERY;  Surgeon: Nancy John, MD;  Location: [place];  Service: Vascular;  Laterality: Right;   ULTRASOUND GUIDANCE FOR VASCULAR ACCESS Bilateral 09/14/2021   Procedure: ULTRASOUND GUIDANCE FOR VASCULAR ACCESS;  Surgeon: Nancy John, MD;   Location: [facility] OR;  Service: Vascular;  Laterality: Bilateral;    Social History   Socioeconomic History   Marital status: Widowed    Spouse name: Not on file   Number of children: 1   Years of education: Not on file   Highest education level: Master's degree (e.g., MA, MS, MEng, MEd, MSW, MBA)  Occupational History   Not on file  Tobacco Use   Smoking status: Former    Types: Cigarettes    Quit date: 12/1988    Years since quitting: 32.8   Smokeless tobacco: Never  Vaping Use   Vaping Use: Never used  Substance and Sexual Activity   Alcohol use: No    Comment: quit 1996   Drug use: No   Sexual activity: Yes    Partners: Male  Other Topics Concern   Not on file  Social History Narrative   Regular exercise: no   Caffeine use: 1-2 cups coffee per day    Lives at home alone with her dog   Right handed   Social Determinants of Health   Financial Resource Strain: Not on file  Food Insecurity: Not on file  Transportation Needs: Not on file  Physical Activity: Not on file  Stress: Not on file  Social Connections: Not on file  Intimate Partner Violence: Not on file    Family History  Problem Relation Age of Onset   Hyperthyroidism Mother    Hypothyroidism Father    Migraines Father  Prostate cancer Brother    Hypothyroidism Brother     Current Outpatient Medications  Medication Sig Dispense Refill   acetaminophen (TYLENOL) 500 MG tablet Take 1,000 mg by mouth every 6 (six) hours as needed for moderate pain.     apixaban (ELIQUIS) 5 MG TABS tablet Take 1 tablet (5 mg total) by mouth 2 (two) times daily. Start when you finish starter pack 180 tablet 0   APIXABAN (ELIQUIS) VTE STARTER PACK (10MG  AND 5MG ) Take as directed on package: start with two-5mg  tablets twice daily for 7 days. On day 8, switch to one-5mg  tablet twice daily. 1 each 0   Calcium-Magnesium-Vitamin D (CALCIUM MAGNESIUM PO) Take 1 tablet by mouth daily.     Cholecalciferol (VITAMIN D) 125 MCG (5000  UT) CAPS Take 5,000 Units by mouth daily.     cyclobenzaprine (FLEXERIL) 10 MG tablet Take 10 mg by mouth at bedtime.     Folic Acid-Vit D6-LOV F64 (FOLBEE) 2.5-25-1 MG TABS tablet Take 1 tablet by mouth daily.     frovatriptan (FROVA) 2.5 MG tablet Take 2.5 mg by mouth as needed for migraine.     NYSTATIN powder Apply 1 application topically 2 (two) times daily as needed.     perphenazine (TRILAFON) 4 MG tablet Take 2 mg by mouth as needed for migraine. PRN every 2 hours     promethazine (PHENERGAN) 25 MG suppository Place 25-50 mg rectally as needed for nausea or vomiting.      rosuvastatin (CRESTOR) 5 MG tablet Take 2.5 mg by mouth at bedtime.      SYNTHROID 88 MCG tablet Take 88 mcg by mouth at bedtime.      No current facility-administered medications for this visit.    Allergies  Allergen Reactions   Citric Acid     migraines   Codeine Nausea And Vomiting    Headache & shakes   Other Nausea And Vomiting    MSG - migraines   Statins     Muscle weakness, tolerates low doses of crestor   Tape Hives   Epinephrine Palpitations   Sulfa Antibiotics Nausea And Vomiting and Rash     REVIEW OF SYSTEMS:   [X]  denotes positive finding, [ ]  denotes negative finding Cardiac  Comments:  Chest pain or chest pressure:    Shortness of breath upon exertion:    Short of breath when lying flat:    Irregular heart rhythm:        Vascular    Pain in calf, thigh, or hip brought on by ambulation:    Pain in feet at night that wakes you up from your sleep:     Blood clot in your veins:    Leg swelling:         Pulmonary    Oxygen at home:    Productive cough:     Wheezing:         Neurologic    Sudden weakness in arms or legs:     Sudden numbness in arms or legs:     Sudden onset of difficulty speaking or slurred speech:    Temporary loss of vision in one eye:     Problems with dizziness:         Gastrointestinal    Blood in stool:     Vomited blood:         Genitourinary     Burning when urinating:     Blood in urine:        Psychiatric  Major depression:         Hematologic    Bleeding problems:    Problems with blood clotting too easily:        Skin    Rashes or ulcers:        Constitutional    Fever or chills:      PHYSICAL EXAMINATION:  There were no vitals filed for this visit.  General:  WDWN in NAD; vital signs documented above Gait: Not observed HENT: WNL, normocephalic Pulmonary: normal non-labored breathing , without wheezing Cardiac: regular HR, Abdomen: soft, NT, no masses Skin: without rashes Vascular Exam/Pulses:  Right Left  Radial 2+ (normal) 2+ (normal)  Ulnar 2+ (normal) 2+ (normal)  Femoral    Popliteal    DP 2+ (normal) 2+ (normal)  PT 2+ (normal) 2+ (normal)   Extremities: without ischemic changes, without Gangrene , without cellulitis; without open wounds;  Musculoskeletal: no muscle wasting or atrophy  Neurologic: A&O X 3;  No focal weakness or paresthesias are detected Psychiatric:  The pt has Normal affect.   Non-Invasive Vascular Imaging:    Summary:  IVC/Iliac: No evidence of thrombus in IVC and Iliac veins.     ASSESSMENT/PLAN: Princes Finger is a 71 y.o. female presenting in follow up s/p 09/14/21 IVC thrombectomy, foreign body removal from migrated kyphoplasty cement. A small portion of cement lodged in a right subsegmental pulmonary artery branch. She tolerated the procedure well and was anticoagulated.   She is asymptomatic on today's visit. Duplex ultrasonography of the inferior vena cava demonstrated normal waveforms, patent, no thrombus. I would like to keep Jaiona anticoagulated for 6 months to allow for epithelialization known cement in a subsegmental branch of the right pulmonary artery. Was asked to call my office should any questions or concerns arise.  Nancy John, MD [facility] [phone]

## 2021-10-16 ENCOUNTER — Other Ambulatory Visit: Payer: Self-pay

## 2021-10-16 ENCOUNTER — Ambulatory Visit (INDEPENDENT_AMBULATORY_CARE_PROVIDER_SITE_OTHER): Payer: Medicare Other | Admitting: Vascular Surgery

## 2021-10-16 ENCOUNTER — Encounter: Payer: Self-pay | Admitting: Vascular Surgery

## 2021-10-16 ENCOUNTER — Ambulatory Visit (HOSPITAL_COMMUNITY)
Admission: RE | Admit: 2021-10-16 | Discharge: 2021-10-16 | Disposition: A | Discharge status: Home / Self Care | Payer: Medicare Other | Source: Ambulatory Visit | Attending: Vascular Surgery | Admitting: Vascular Surgery

## 2021-10-16 VITALS — BP 125/83 | HR 73 | Temp 97.2°F | Resp 14 | Ht 63.0 in | Wt 150.0 lb

## 2021-10-16 DIAGNOSIS — I82220 Acute embolism and thrombosis of inferior vena cava: Secondary | ICD-10-CM | POA: Insufficient documentation

## 2021-10-16 DIAGNOSIS — T17808S Unspecified foreign body in other parts of respiratory tract causing other injury, sequela: Secondary | ICD-10-CM

## 2021-10-16 DIAGNOSIS — Z9889 Other specified postprocedural states: Secondary | ICD-10-CM

## 2021-10-23 ENCOUNTER — Other Ambulatory Visit: Payer: Self-pay | Admitting: *Deleted

## 2021-10-23 DIAGNOSIS — I82220 Acute embolism and thrombosis of inferior vena cava: Secondary | ICD-10-CM

## 2021-10-26 DIAGNOSIS — M533 Sacrococcygeal disorders, not elsewhere classified: Secondary | ICD-10-CM | POA: Diagnosis not present

## 2021-10-27 DIAGNOSIS — M47896 Other spondylosis, lumbar region: Secondary | ICD-10-CM | POA: Diagnosis not present

## 2021-10-28 DIAGNOSIS — M7061 Trochanteric bursitis, right hip: Secondary | ICD-10-CM | POA: Diagnosis not present

## 2021-10-28 DIAGNOSIS — M7062 Trochanteric bursitis, left hip: Secondary | ICD-10-CM | POA: Diagnosis not present

## 2021-10-30 DIAGNOSIS — M47896 Other spondylosis, lumbar region: Secondary | ICD-10-CM | POA: Diagnosis not present

## 2021-11-02 DIAGNOSIS — M47896 Other spondylosis, lumbar region: Secondary | ICD-10-CM | POA: Diagnosis not present

## 2021-11-03 DIAGNOSIS — M533 Sacrococcygeal disorders, not elsewhere classified: Secondary | ICD-10-CM | POA: Diagnosis not present

## 2021-11-27 ENCOUNTER — Telehealth: Payer: Self-pay

## 2021-11-27 ENCOUNTER — Other Ambulatory Visit: Payer: Self-pay | Admitting: Vascular Surgery

## 2021-11-27 MED ORDER — APIXABAN 5 MG PO TABS
5.0000 mg | ORAL_TABLET | Freq: Two times a day (BID) | ORAL | 0 refills | Status: DC
Start: 2021-11-27 — End: 2022-03-01

## 2021-11-27 NOTE — Telephone Encounter (Signed)
Pt called from [place] to let us know she is running out of Eliquis as of tomorrow. MD has refilled this for her to [facility] in [place]. Pt is aware of this and that she must pick up this rx and is to remain on Eliquis. ?

## 2021-12-07 DIAGNOSIS — M47896 Other spondylosis, lumbar region: Secondary | ICD-10-CM | POA: Diagnosis not present

## 2021-12-09 DIAGNOSIS — M47896 Other spondylosis, lumbar region: Secondary | ICD-10-CM | POA: Diagnosis not present

## 2021-12-09 DIAGNOSIS — M7061 Trochanteric bursitis, right hip: Secondary | ICD-10-CM | POA: Diagnosis not present

## 2021-12-16 DIAGNOSIS — M47896 Other spondylosis, lumbar region: Secondary | ICD-10-CM | POA: Diagnosis not present

## 2021-12-21 DIAGNOSIS — M47896 Other spondylosis, lumbar region: Secondary | ICD-10-CM | POA: Diagnosis not present

## 2021-12-23 DIAGNOSIS — M47896 Other spondylosis, lumbar region: Secondary | ICD-10-CM | POA: Diagnosis not present

## 2021-12-28 DIAGNOSIS — M47896 Other spondylosis, lumbar region: Secondary | ICD-10-CM | POA: Diagnosis not present

## 2022-01-01 DIAGNOSIS — F418 Other specified anxiety disorders: Secondary | ICD-10-CM | POA: Diagnosis not present

## 2022-01-01 DIAGNOSIS — R030 Elevated blood-pressure reading, without diagnosis of hypertension: Secondary | ICD-10-CM | POA: Diagnosis not present

## 2022-01-01 DIAGNOSIS — M609 Myositis, unspecified: Secondary | ICD-10-CM | POA: Diagnosis not present

## 2022-01-01 DIAGNOSIS — T466X5A Adverse effect of antihyperlipidemic and antiarteriosclerotic drugs, initial encounter: Secondary | ICD-10-CM | POA: Diagnosis not present

## 2022-01-04 DIAGNOSIS — M7062 Trochanteric bursitis, left hip: Secondary | ICD-10-CM | POA: Diagnosis not present

## 2022-01-04 DIAGNOSIS — M25551 Pain in right hip: Secondary | ICD-10-CM | POA: Diagnosis not present

## 2022-01-04 DIAGNOSIS — M7061 Trochanteric bursitis, right hip: Secondary | ICD-10-CM | POA: Diagnosis not present

## 2022-01-06 ENCOUNTER — Telehealth: Payer: Self-pay

## 2022-01-06 DIAGNOSIS — M791 Myalgia, unspecified site: Secondary | ICD-10-CM | POA: Diagnosis not present

## 2022-01-06 DIAGNOSIS — G43719 Chronic migraine without aura, intractable, without status migrainosus: Secondary | ICD-10-CM | POA: Diagnosis not present

## 2022-01-06 DIAGNOSIS — M47896 Other spondylosis, lumbar region: Secondary | ICD-10-CM | POA: Diagnosis not present

## 2022-01-06 DIAGNOSIS — G518 Other disorders of facial nerve: Secondary | ICD-10-CM | POA: Diagnosis not present

## 2022-01-06 DIAGNOSIS — M542 Cervicalgia: Secondary | ICD-10-CM | POA: Diagnosis not present

## 2022-01-06 NOTE — Telephone Encounter (Signed)
Patient called stating she is needing to schedule a dental procedure but she needs clearance to hold her Eliquis from Dr.Robins. I reached out to Dr. Virl Cagey, he recommends pt to start ASA, hold Eliquis 2 days prior to her dental procedure then resume once her orthodontist recommends. Patient advised and she voiced her understand. ?

## 2022-01-12 DIAGNOSIS — M47896 Other spondylosis, lumbar region: Secondary | ICD-10-CM | POA: Diagnosis not present

## 2022-01-13 ENCOUNTER — Other Ambulatory Visit: Payer: Self-pay | Admitting: Obstetrics and Gynecology

## 2022-01-13 DIAGNOSIS — Z1231 Encounter for screening mammogram for malignant neoplasm of breast: Secondary | ICD-10-CM

## 2022-01-14 ENCOUNTER — Other Ambulatory Visit: Payer: Self-pay | Admitting: Obstetrics and Gynecology

## 2022-01-14 DIAGNOSIS — N644 Mastodynia: Secondary | ICD-10-CM | POA: Diagnosis not present

## 2022-01-14 DIAGNOSIS — M79622 Pain in left upper arm: Secondary | ICD-10-CM

## 2022-01-18 DIAGNOSIS — M47896 Other spondylosis, lumbar region: Secondary | ICD-10-CM | POA: Diagnosis not present

## 2022-01-26 ENCOUNTER — Ambulatory Visit
Admission: RE | Admit: 2022-01-26 | Discharge: 2022-01-26 | Disposition: A | Discharge status: Home / Self Care | Payer: Medicare Other | Source: Ambulatory Visit | Attending: Obstetrics and Gynecology | Admitting: Obstetrics and Gynecology

## 2022-01-26 DIAGNOSIS — M47896 Other spondylosis, lumbar region: Secondary | ICD-10-CM | POA: Diagnosis not present

## 2022-01-26 DIAGNOSIS — M79622 Pain in left upper arm: Secondary | ICD-10-CM

## 2022-01-26 DIAGNOSIS — R922 Inconclusive mammogram: Secondary | ICD-10-CM | POA: Diagnosis not present

## 2022-01-26 DIAGNOSIS — N6489 Other specified disorders of breast: Secondary | ICD-10-CM | POA: Diagnosis not present

## 2022-02-03 DIAGNOSIS — M47896 Other spondylosis, lumbar region: Secondary | ICD-10-CM | POA: Diagnosis not present

## 2022-02-08 DIAGNOSIS — M47896 Other spondylosis, lumbar region: Secondary | ICD-10-CM | POA: Diagnosis not present

## 2022-02-17 DIAGNOSIS — M47896 Other spondylosis, lumbar region: Secondary | ICD-10-CM | POA: Diagnosis not present

## 2022-02-18 DIAGNOSIS — M533 Sacrococcygeal disorders, not elsewhere classified: Secondary | ICD-10-CM | POA: Diagnosis not present

## 2022-02-22 DIAGNOSIS — M47896 Other spondylosis, lumbar region: Secondary | ICD-10-CM | POA: Diagnosis not present

## 2022-02-27 ENCOUNTER — Other Ambulatory Visit: Payer: Self-pay | Admitting: Vascular Surgery

## 2022-03-02 DIAGNOSIS — M47896 Other spondylosis, lumbar region: Secondary | ICD-10-CM | POA: Diagnosis not present

## 2022-04-09 DIAGNOSIS — M533 Sacrococcygeal disorders, not elsewhere classified: Secondary | ICD-10-CM | POA: Diagnosis not present

## 2022-04-09 DIAGNOSIS — R030 Elevated blood-pressure reading, without diagnosis of hypertension: Secondary | ICD-10-CM | POA: Diagnosis not present

## 2022-04-09 DIAGNOSIS — F418 Other specified anxiety disorders: Secondary | ICD-10-CM | POA: Diagnosis not present

## 2022-04-09 DIAGNOSIS — E039 Hypothyroidism, unspecified: Secondary | ICD-10-CM | POA: Diagnosis not present

## 2022-04-09 DIAGNOSIS — E785 Hyperlipidemia, unspecified: Secondary | ICD-10-CM | POA: Diagnosis not present

## 2022-04-09 DIAGNOSIS — M5136 Other intervertebral disc degeneration, lumbar region: Secondary | ICD-10-CM | POA: Diagnosis not present

## 2022-04-30 DIAGNOSIS — H1033 Unspecified acute conjunctivitis, bilateral: Secondary | ICD-10-CM | POA: Diagnosis not present

## 2022-05-14 ENCOUNTER — Other Ambulatory Visit: Payer: Self-pay | Admitting: *Deleted

## 2022-05-14 DIAGNOSIS — I82220 Acute embolism and thrombosis of inferior vena cava: Secondary | ICD-10-CM

## 2022-05-21 ENCOUNTER — Encounter: Payer: Self-pay | Admitting: Vascular Surgery

## 2022-05-21 ENCOUNTER — Ambulatory Visit (INDEPENDENT_AMBULATORY_CARE_PROVIDER_SITE_OTHER): Payer: Medicare Other | Admitting: Vascular Surgery

## 2022-05-21 ENCOUNTER — Ambulatory Visit (HOSPITAL_COMMUNITY)
Admission: RE | Admit: 2022-05-21 | Discharge: 2022-05-21 | Disposition: A | Discharge status: Home / Self Care | Payer: Medicare Other | Source: Ambulatory Visit | Attending: Vascular Surgery | Admitting: Vascular Surgery

## 2022-05-21 VITALS — BP 151/83 | HR 71 | Temp 97.7°F | Resp 20 | Ht 63.0 in | Wt 147.0 lb

## 2022-05-21 DIAGNOSIS — I82220 Acute embolism and thrombosis of inferior vena cava: Secondary | ICD-10-CM | POA: Diagnosis not present

## 2022-05-21 DIAGNOSIS — T17808S Unspecified foreign body in other parts of respiratory tract causing other injury, sequela: Secondary | ICD-10-CM

## 2022-05-21 NOTE — Progress Notes (Signed)
Office Note    HPI: Nancy Anthony is a 71 y.o. (20-Jan-1951) female presenting in follow up s/p 09/14/21 IVC thrombectomy, foreign body removal from migrated kyphoplasty cement. A small portion of cement lodged in a right subsegmental pulmonary artery branch. She tolerated the procedure well and was anticoagulated.   On exam today, Nancy Anthony was doing well.  She has had no issues over the last 8 months.  No lower extremity swelling, no abdominal pain, no back pain.  Patient will be moving to [facility] to be closer to her daughter in the coming weeks, which she is excited about.  Currently anticoagulated with Eliquis.  Past Medical History:  Diagnosis Date   Anemia    Hashimoto's disease    71 years old   Hyperlipidemia    Hypothyroidism    Migraine    Pneumonia    Ruptured disk    Thyroid disease     Past Surgical History:  Procedure Laterality Date   BREAST BIOPSY     BREAST EXCISIONAL BIOPSY Right 25+ yrs ago   benign   BREAST EXCISIONAL BIOPSY Left 25+ yrs ago   benign   CHOLECYSTECTOMY N/A 11/15/2017   Procedure: LAPAROSCOPIC CHOLECYSTECTOMY WITH INTRAOPERATIVE CHOLANGIOGRAM ERAS PATHWAY;  Surgeon: Donnie Mesa, MD;  Location: [place];  Service: General;  Laterality: N/A;   COLONOSCOPY     CYST REMOVAL L EAR Left 1997   DIAGNOSTIC LAPAROSCOPY     KYPHOPLASTY N/A 09/03/2021   Procedure: LUMBAR 2 KYPHOPLASTY;  Surgeon: Phylliss Bob, MD;  Location: [place];  Service: Orthopedics;  Laterality: N/A;   THROMBECTOMY ILIAC ARTERY Right 09/14/2021   Procedure: THROMBECTOMY PULMONARY ARTERY;  Surgeon: Broadus John, MD;  Location: [place];  Service: Vascular;  Laterality: Right;   ULTRASOUND GUIDANCE FOR VASCULAR ACCESS Bilateral 09/14/2021   Procedure: ULTRASOUND GUIDANCE FOR VASCULAR ACCESS;  Surgeon: Broadus John, MD;  Location: [facility] OR;  Service: Vascular;  Laterality: Bilateral;    Social History   Socioeconomic History   Marital status: Widowed    Spouse name: Not on  file   Number of children: 1   Years of education: Not on file   Highest education level: Master's degree (e.g., MA, MS, MEng, MEd, MSW, MBA)  Occupational History   Not on file  Tobacco Use   Smoking status: Former    Types: Cigarettes    Quit date: 12/1988    Years since quitting: 33.4   Smokeless tobacco: Never  Vaping Use   Vaping Use: Never used  Substance and Sexual Activity   Alcohol use: No    Comment: quit 1996   Drug use: No   Sexual activity: Yes    Partners: Male  Other Topics Concern   Not on file  Social History Narrative   Regular exercise: no   Caffeine use: 1-2 cups coffee per day    Lives at home alone with her dog   Right handed   Social Determinants of Health   Financial Resource Strain: Not on file  Food Insecurity: Not on file  Transportation Needs: Not on file  Physical Activity: Not on file  Stress: Not on file  Social Connections: Not on file  Intimate Partner Violence: Not on file    Family History  Problem Relation Age of Onset   Hyperthyroidism Mother    Hypothyroidism Father    Migraines Father    Prostate cancer Brother    Hypothyroidism Brother     Current Outpatient Medications  Medication  Sig Dispense Refill   acetaminophen (TYLENOL) 500 MG tablet Take 1,000 mg by mouth every 6 (six) hours as needed for moderate pain.     APIXABAN (ELIQUIS) VTE STARTER PACK ('10MG'$  AND '5MG'$ ) Take as directed on package: start with two-'5mg'$  tablets twice daily for 7 days. On day 8, switch to one-'5mg'$  tablet twice daily. (Patient not taking: Reported on 10/16/2021) 1 each 0   Calcium-Magnesium-Vitamin D (CALCIUM MAGNESIUM PO) Take 1 tablet by mouth daily.     Cholecalciferol (VITAMIN D) 125 MCG (5000 UT) CAPS Take 5,000 Units by mouth daily.     cyclobenzaprine (FLEXERIL) 10 MG tablet Take 10 mg by mouth at bedtime.     ELIQUIS 5 MG TABS tablet TAKE 1 TABLET BY MOUTH TWICE DAILY . START  WHEN  YOU  FINISH  STARTER  PACK 629 tablet 0   Folic Acid-Vit  B2-WUX B12 (FOLBEE) 2.5-25-1 MG TABS tablet Take 1 tablet by mouth daily.     frovatriptan (FROVA) 2.5 MG tablet Take 2.5 mg by mouth as needed for migraine.     NYSTATIN powder Apply 1 application topically 2 (two) times daily as needed.     perphenazine (TRILAFON) 4 MG tablet Take 2 mg by mouth as needed for migraine. PRN every 2 hours     promethazine (PHENERGAN) 25 MG suppository Place 25-50 mg rectally as needed for nausea or vomiting.      rosuvastatin (CRESTOR) 5 MG tablet Take 2.5 mg by mouth at bedtime.      SYNTHROID 88 MCG tablet Take 88 mcg by mouth at bedtime.      No current facility-administered medications for this visit.    Allergies  Allergen Reactions   Citric Acid     migraines   Codeine Nausea And Vomiting    Headache & shakes   Other Nausea And Vomiting    MSG - migraines   Statins     Muscle weakness, tolerates low doses of crestor   Tape Hives   Epinephrine Palpitations   Sulfa Antibiotics Nausea And Vomiting and Rash     REVIEW OF SYSTEMS:   '[X]'$  denotes positive finding, '[ ]'$  denotes negative finding Cardiac  Comments:  Chest pain or chest pressure:    Shortness of breath upon exertion:    Short of breath when lying flat:    Irregular heart rhythm:        Vascular    Pain in calf, thigh, or hip brought on by ambulation:    Pain in feet at night that wakes you up from your sleep:     Blood clot in your veins:    Leg swelling:         Pulmonary    Oxygen at home:    Productive cough:     Wheezing:         Neurologic    Sudden weakness in arms or legs:     Sudden numbness in arms or legs:     Sudden onset of difficulty speaking or slurred speech:    Temporary loss of vision in one eye:     Problems with dizziness:         Gastrointestinal    Blood in stool:     Vomited blood:         Genitourinary    Burning when urinating:     Blood in urine:        Psychiatric    Major depression:  Hematologic    Bleeding problems:     Problems with blood clotting too easily:        Skin    Rashes or ulcers:        Constitutional    Fever or chills:      PHYSICAL EXAMINATION:  There were no vitals filed for this visit.  General:  WDWN in NAD; vital signs documented above Gait: Not observed HENT: WNL, normocephalic Pulmonary: normal non-labored breathing , without wheezing Cardiac: regular HR, Abdomen: soft, NT, no masses Skin: without rashes Vascular Exam/Pulses:  Right Left  Radial 2+ (normal) 2+ (normal)  Ulnar 2+ (normal) 2+ (normal)  Femoral    Popliteal    DP 2+ (normal) 2+ (normal)  PT 2+ (normal) 2+ (normal)   Extremities: without ischemic changes, without Gangrene , without cellulitis; without open wounds;  Musculoskeletal: no muscle wasting or atrophy  Neurologic: A&O X 3;  No focal weakness or paresthesias are detected Psychiatric:  The pt has Normal affect.   Non-Invasive Vascular Imaging:    Summary:  IVC/Iliac: No evidence of thrombus in IVC and Iliac veins.     ASSESSMENT/PLAN: LALIA LOUDON is a 71 y.o. female presenting in follow up s/p 09/14/21 IVC thrombectomy, foreign body removal from migrated kyphoplasty cement. A small portion of cement lodged in a right subsegmental pulmonary artery branch. She tolerated the procedure well and was anticoagulated.   She is asymptomatic on today's visit. Duplex ultrasonography of the inferior vena cava demonstrated normal waveforms, patent, no thrombus.  Helene Kelp can stop anticoagulation at this point as the cement in the subsegmental branch of the right pulmonary artery has epithelialized.  Recommend continue baby aspirin lifelong.  Helene Kelp can follow-up with me as needed She was asked to call my office should any questions or concerns arise.  Broadus John, MD [facility] [phone]

## 2022-05-26 DIAGNOSIS — G43719 Chronic migraine without aura, intractable, without status migrainosus: Secondary | ICD-10-CM | POA: Diagnosis not present

## 2022-07-02 DIAGNOSIS — E039 Hypothyroidism, unspecified: Secondary | ICD-10-CM | POA: Diagnosis not present

## 2022-07-02 DIAGNOSIS — E785 Hyperlipidemia, unspecified: Secondary | ICD-10-CM | POA: Diagnosis not present

## 2022-07-28 DIAGNOSIS — M542 Cervicalgia: Secondary | ICD-10-CM | POA: Diagnosis not present

## 2022-07-28 DIAGNOSIS — G518 Other disorders of facial nerve: Secondary | ICD-10-CM | POA: Diagnosis not present

## 2022-07-28 DIAGNOSIS — G43719 Chronic migraine without aura, intractable, without status migrainosus: Secondary | ICD-10-CM | POA: Diagnosis not present

## 2022-07-28 DIAGNOSIS — M791 Myalgia, unspecified site: Secondary | ICD-10-CM | POA: Diagnosis not present

## 2022-08-02 DIAGNOSIS — R0981 Nasal congestion: Secondary | ICD-10-CM | POA: Diagnosis not present

## 2022-08-02 DIAGNOSIS — J019 Acute sinusitis, unspecified: Secondary | ICD-10-CM | POA: Diagnosis not present

## 2022-08-11 ENCOUNTER — Encounter (HOSPITAL_BASED_OUTPATIENT_CLINIC_OR_DEPARTMENT_OTHER): Payer: Self-pay | Admitting: Internal Medicine

## 2022-08-11 ENCOUNTER — Ambulatory Visit (INDEPENDENT_AMBULATORY_CARE_PROVIDER_SITE_OTHER): Payer: Medicare Other | Admitting: Internal Medicine

## 2022-08-11 ENCOUNTER — Telehealth: Payer: Self-pay | Admitting: Internal Medicine

## 2022-08-11 VITALS — BP 160/92 | HR 67 | Ht 63.0 in | Wt 149.0 lb

## 2022-08-11 DIAGNOSIS — M791 Myalgia, unspecified site: Secondary | ICD-10-CM | POA: Diagnosis not present

## 2022-08-11 DIAGNOSIS — T466X5A Adverse effect of antihyperlipidemic and antiarteriosclerotic drugs, initial encounter: Secondary | ICD-10-CM

## 2022-08-11 DIAGNOSIS — E7801 Familial hypercholesterolemia: Secondary | ICD-10-CM

## 2022-08-11 NOTE — Progress Notes (Signed)
LIPID CLINIC CONSULT NOTE  Chief Complaint:  Manage dyslipidemia  Primary Care Physician: Harlan Stains, MD  Primary Cardiologist:  None  HPI:  Nancy Anthony is a 71 y.o. female who is being seen today for the evaluation of dyslipidemia at the request of Harlan Stains, MD. This is a pleasant 71 yo female who is referred for dyslipidemia and statin intolerance.  Recent lipid showed total cholesterol 292, triglycerides 138, HDL 82 and LDL 185.  She does report history of high cholesterol and her sister as well as her brother however he died of prostate cancer.  She did say that he had bilateral carotid artery disease and her father also died of an MI at age 57.  She does have a history of some aortic calcification noted on CT abdomen was performed in January of this year after surgery for L2 kyphoplasty which incidentally showed cement extending into the right paravertebral veins and into the IVC with thrombus noted.  This required urgent vascular surgery to correct.  She also has a history of statin intolerance having tried several statins which caused muscle aches.  She reports intolerance to citric acid and MSG which worsen her headaches.  PMHx:  Past Medical History:  Diagnosis Date   Anemia    Hashimoto's disease    71 years old   Hyperlipidemia    Hypothyroidism    Migraine    Pneumonia    Ruptured disk    Thyroid disease     Past Surgical History:  Procedure Laterality Date   BREAST BIOPSY     BREAST EXCISIONAL BIOPSY Right 25+ yrs ago   benign   BREAST EXCISIONAL BIOPSY Left 25+ yrs ago   benign   CHOLECYSTECTOMY N/A 11/15/2017   Procedure: LAPAROSCOPIC CHOLECYSTECTOMY WITH INTRAOPERATIVE CHOLANGIOGRAM ERAS PATHWAY;  Surgeon: Donnie Mesa, MD;  Location: [place];  Service: General;  Laterality: N/A;   COLONOSCOPY     CYST REMOVAL L EAR Left 1997   DIAGNOSTIC LAPAROSCOPY     KYPHOPLASTY N/A 09/03/2021   Procedure: LUMBAR 2 KYPHOPLASTY;  Surgeon: Phylliss Bob, MD;  Location: [place];  Service: Orthopedics;  Laterality: N/A;   THROMBECTOMY ILIAC ARTERY Right 09/14/2021   Procedure: THROMBECTOMY PULMONARY ARTERY;  Surgeon: Broadus John, MD;  Location: [place];  Service: Vascular;  Laterality: Right;   ULTRASOUND GUIDANCE FOR VASCULAR ACCESS Bilateral 09/14/2021   Procedure: ULTRASOUND GUIDANCE FOR VASCULAR ACCESS;  Surgeon: Broadus John, MD;  Location: [facility] OR;  Service: Vascular;  Laterality: Bilateral;    FAMHx:  Family History  Problem Relation Age of Onset   Hyperthyroidism Mother    Hypothyroidism Father    Migraines Father    Prostate cancer Brother    Hypothyroidism Brother     SOCHx:   reports that she quit smoking about 33 years ago. Her smoking use included cigarettes. She has never used smokeless tobacco. She reports that she does not drink alcohol and does not use drugs.  ALLERGIES:  Allergies  Allergen Reactions   Citric Acid     migraines   Codeine Nausea And Vomiting    Headache & shakes   Other Nausea And Vomiting    MSG - migraines   Statins     Muscle weakness, tolerates low doses of crestor   Tape Hives   Epinephrine Palpitations   Sulfa Antibiotics Nausea And Vomiting and Rash    ROS: Pertinent items noted in HPI and remainder of comprehensive ROS otherwise negative.  HOME  MEDS: Current Outpatient Medications on File Prior to Visit  Medication Sig Dispense Refill   acetaminophen (TYLENOL) 500 MG tablet Take 1,000 mg by mouth every 6 (six) hours as needed for moderate pain.     aspirin EC 81 MG tablet Take 81 mg by mouth daily. Swallow whole.     Calcium-Magnesium-Vitamin D (CALCIUM MAGNESIUM PO) Take 1 tablet by mouth daily.     Cholecalciferol (VITAMIN D) 125 MCG (5000 UT) CAPS Take 5,000 Units by mouth daily.     EMGALITY 120 MG/ML SOAJ Inject into the skin every 30 (thirty) days.     Folic Acid-Vit S5-KNL Z76 (FOLBEE) 2.5-25-1 MG TABS tablet Take 1 tablet by mouth daily.     frovatriptan (FROVA) 2.5  MG tablet Take 2.5 mg by mouth as needed for migraine.     metaxalone (SKELAXIN) 800 MG tablet Take 400-800 mg by mouth 2 (two) times daily as needed.     NYSTATIN powder Apply 1 application topically 2 (two) times daily as needed.     perphenazine (TRILAFON) 4 MG tablet Take 2 mg by mouth as needed for migraine. PRN every 2 hours     promethazine (PHENERGAN) 25 MG suppository Place 25-50 mg rectally as needed for nausea or vomiting.      SYNTHROID 88 MCG tablet Take 88 mcg by mouth at bedtime.      No current facility-administered medications on file prior to visit.    LABS/IMAGING: No results found for this or any previous visit (from the past 48 hour(s)). No results found.  LIPID PANEL: No results found for: "CHOL", "TRIG", "HDL", "CHOLHDL", "VLDL", "LDLCALC", "LDLDIRECT"  WEIGHTS: Wt Readings from Last 3 Encounters:  08/11/22 149 lb (67.6 kg)  05/21/22 147 lb (66.7 kg)  10/16/21 150 lb (68 kg)    VITALS: BP (!) 160/92   Pulse 67   Ht '5\' 3"'$  (1.6 m)   Wt 149 lb (67.6 kg)   BMI 26.39 kg/m   EXAM: Deferred  EKG: Deferred  ASSESSMENT: Possible familial hyperlipidemia, Simon Broome criteria First-degree relatives with high cholesterol and early onset heart disease Statin intolerant-myalgias  PLAN: 1.   Ms. Wilborn has a mixed dyslipidemia with LDL in the 180's.  There is high cholesterol amongst many family members as well as early onset heart disease and this is concerning for familial hyperlipidemia.  She cannot tolerate statins having tried several.  Recently she was prescribed ezetimibe but had not yet started taking it.  She did want to discuss options with me first.  I would like to see if there is any cardiovascular disease.  I think this would be helpful to further with stratify her but ultimately she will need therapy.  Zetia is unlikely to bring her LDL cholesterol down below target of 100 if she was considered lower risk or 70 if she was intermediate to high risk.   Her insurance actually is favorable for Leqvio and we may consider this option as it may be totally covered if she has a supplemental part G plan which she believes she does.  We will reach out to her with treatment options after her calcium score and obtaining a LP(a).  Thanks again for the kind referral.  Pixie Casino, MD, FACC, [place] Director of the Advanced Lipid Disorders &  Cardiovascular Risk Reduction Clinic Diplomate of the American Board of Clinical Lipidology Attending Cardiologist  Direct Dial: [phone]  Fax: [phone]  Website:  [email or web address]  Pixie Casino 08/11/2022, 8:38 PM

## 2022-08-11 NOTE — Patient Instructions (Addendum)
Medication Instructions:  Dr. Debara Pickett has recommended LEQVIO. We will have to check your benefits (officially) for this and will contact you when we have info on this.   The injection schedule is: 2 doses given 3 months apart -- then every 6 months  Injections are given at: [place] at [facility] [address] [place],  [place]  [address]   *If you need a refill on your cardiac medications before your next appointment, please call your pharmacy*   Lab Work: FASTING lab work to check cholesterol in 5-6 months -- complete about 1 week before next appointment   If you have labs (blood work) drawn today and your tests are completely normal, you will receive your results only by: [place] (if you have MyChart) OR A paper copy in the mail If you have any lab test that is abnormal or we need to change your treatment, we will call you to review the results.   Testing/Procedures: Dr. Debara Pickett has ordered a CT coronary calcium score.   Test locations:  [place]   This is $99 out of pocket.   Coronary CalciumScan A coronary calcium scan is an imaging test used to look for deposits of calcium and other fatty materials (plaques) in the inner lining of the blood vessels of the heart (coronary arteries). These deposits of calcium and plaques can partly clog and narrow the coronary arteries without producing any symptoms or warning signs. This puts a person at risk for a heart attack. This test can detect these deposits before symptoms develop. Tell a health care provider about: Any allergies you have. All medicines you are taking, including vitamins, herbs, eye drops, creams, and over-the-counter medicines. Any problems you or family members have had with anesthetic medicines. Any blood disorders you have. Any surgeries you have had. Any medical conditions you have. Whether you are pregnant or may be pregnant. What are  the risks? Generally, this is a safe procedure. However, problems may occur, including: Harm to a pregnant woman and her unborn baby. This test involves the use of radiation. Radiation exposure can be dangerous to a pregnant woman and her unborn baby. If you are pregnant, you generally should not have this procedure done. Slight increase in the risk of cancer. This is because of the radiation involved in the test. What happens before the procedure? No preparation is needed for this procedure. What happens during the procedure? You will undress and remove any jewelry around your neck or chest. You will put on a hospital gown. Sticky electrodes will be placed on your chest. The electrodes will be connected to an electrocardiogram (ECG) machine to record a tracing of the electrical activity of your heart. A CT scanner will take pictures of your heart. During this time, you will be asked to lie still and hold your breath for 2-3 seconds while a picture of your heart is being taken. The procedure may vary among health care providers and hospitals. What happens after the procedure? You can get dressed. You can return to your normal activities. It is up to you to get the results of your test. Ask your health care provider, or the department that is doing the test, when your results will be ready. Summary A coronary calcium scan is an imaging test used to look for deposits of calcium and other fatty materials (plaques) in the inner lining of the blood vessels of the heart (coronary arteries). Generally,  this is a safe procedure. Tell your health care provider if you are pregnant or may be pregnant. No preparation is needed for this procedure. A CT scanner will take pictures of your heart. You can return to your normal activities after the scan is done. This information is not intended to replace advice given to you by your health care provider. Make sure you discuss any questions you have with your  health care provider. Document Released: 02/19/2008 Document Revised: 07/12/2016 Document Reviewed: 07/12/2016 Elsevier Interactive Patient Education  2017 [place]: At [facility], you and your health needs are our priority.  As part of our continuing mission to provide you with exceptional heart care, we have created designated Provider Care Teams.  These Care Teams include your primary Cardiologist (physician) and Advanced Practice Providers (APPs -  Physician Assistants and Nurse Practitioners) who all work together to provide you with the care you need, when you need it.  We recommend signing up for the patient portal called "MyChart".  Sign up information is provided on this After Visit Summary.  MyChart is used to connect with patients for Virtual Visits (Telemedicine).  Patients are able to view lab/test results, encounter notes, upcoming appointments, etc.  Non-urgent messages can be sent to your provider as well.   To learn more about what you can do with MyChart, go to [email or web address].    Your next appointment:    5-6 months with Dr. Debara Pickett

## 2022-08-11 NOTE — Telephone Encounter (Signed)
Patient seen in office 08/11/22 Dr. Debara Pickett would like to prescribe Leqvio Patient has BCBS supplement plan that covers deductible + coinsurance  Will submit form to [facility] via fax at [phone]

## 2022-08-13 NOTE — Telephone Encounter (Signed)
Received fax with statement of benefits for Leqvio: her Medicare part B will cover Leqvio at 80% and her BCBS [place] supplement plan G will cover the remaining 20% coinsurance. It does not cover the annual Part B deductible ($226 per year, she has already met for 2023 year). No prior auth needed. Will need to get Leqvio from infusion center at [facility] or on [employer].

## 2022-08-13 NOTE — Telephone Encounter (Signed)
Update sent to patient in MyChart 

## 2022-08-17 ENCOUNTER — Encounter: Payer: Self-pay | Admitting: Internal Medicine

## 2022-08-17 DIAGNOSIS — E7801 Familial hypercholesterolemia: Secondary | ICD-10-CM | POA: Insufficient documentation

## 2022-08-17 NOTE — Addendum Note (Signed)
Addended by: Fidel Levy on: 08/17/2022 08:57 AM   Modules accepted: Orders

## 2022-08-17 NOTE — Telephone Encounter (Signed)
First Leqvio injection scheduled for 08/24/22

## 2022-08-17 NOTE — Telephone Encounter (Signed)
Referral placed to [facility]

## 2022-08-17 NOTE — Telephone Encounter (Signed)
Auth Submission: NO AUTH NEEDED Payer: MEDICARE A/B & BCBS SUPP Medication & CPT/J Code(s) submitted: Leqvio (Inclisiran) [phone] Route of submission (phone, fax, portal):  Phone # Fax # Auth type: Buy/Bill Units/visits requested: X1 DOSE Reference number:  Approval from: 08/17/22 to 09/05/22   Medicare will cover 80% and BCBS supp will pick-up remaining 20%.  Leqvio will be covered 100%.  '@Jenna'$ , Patient will be scheduled as soon as possible.  Maudie Mercury

## 2022-08-24 ENCOUNTER — Ambulatory Visit (INDEPENDENT_AMBULATORY_CARE_PROVIDER_SITE_OTHER): Payer: Medicare Other

## 2022-08-24 VITALS — BP 150/82 | HR 67 | Temp 98.1°F | Resp 18 | Ht 63.0 in | Wt 152.0 lb

## 2022-08-24 DIAGNOSIS — E7801 Familial hypercholesterolemia: Secondary | ICD-10-CM

## 2022-08-24 MED ORDER — INCLISIRAN SODIUM 284 MG/1.5ML ~~LOC~~ SOSY
284.0000 mg | PREFILLED_SYRINGE | Freq: Once | SUBCUTANEOUS | Status: AC
  Administered 2022-08-24: 284 mg via SUBCUTANEOUS
  Filled 2022-08-24: qty 1.5

## 2022-08-24 NOTE — Progress Notes (Signed)
Diagnosis: Hyperlipidemia  Provider:  Marshell Garfinkel MD  Procedure: Injection  Leqvio (inclisiran), Dose: 284 mg, Site: subcutaneous, Number of injections: 1  Post Care: Observation period completed  Discharge: Condition: Good, Destination: Home . AVS provided to patient.   Performed by:  Arnoldo Morale, RN

## 2022-09-09 DIAGNOSIS — Z124 Encounter for screening for malignant neoplasm of cervix: Secondary | ICD-10-CM | POA: Diagnosis not present

## 2022-09-09 DIAGNOSIS — Z1151 Encounter for screening for human papillomavirus (HPV): Secondary | ICD-10-CM | POA: Diagnosis not present

## 2022-09-09 DIAGNOSIS — Z6827 Body mass index (BMI) 27.0-27.9, adult: Secondary | ICD-10-CM | POA: Diagnosis not present

## 2022-09-17 ENCOUNTER — Encounter: Payer: Self-pay | Admitting: Internal Medicine

## 2022-09-20 DIAGNOSIS — D692 Other nonthrombocytopenic purpura: Secondary | ICD-10-CM | POA: Diagnosis not present

## 2022-09-20 DIAGNOSIS — L570 Actinic keratosis: Secondary | ICD-10-CM | POA: Diagnosis not present

## 2022-09-20 DIAGNOSIS — L821 Other seborrheic keratosis: Secondary | ICD-10-CM | POA: Diagnosis not present

## 2022-09-20 DIAGNOSIS — L218 Other seborrheic dermatitis: Secondary | ICD-10-CM | POA: Diagnosis not present

## 2022-09-20 DIAGNOSIS — L300 Nummular dermatitis: Secondary | ICD-10-CM | POA: Diagnosis not present

## 2022-09-24 ENCOUNTER — Ambulatory Visit (HOSPITAL_BASED_OUTPATIENT_CLINIC_OR_DEPARTMENT_OTHER)
Admission: RE | Admit: 2022-09-24 | Discharge: 2022-09-24 | Disposition: A | Discharge status: Home / Self Care | Payer: Medicare Other | Source: Ambulatory Visit | Attending: Internal Medicine | Admitting: Internal Medicine

## 2022-09-24 DIAGNOSIS — E7801 Familial hypercholesterolemia: Secondary | ICD-10-CM | POA: Insufficient documentation

## 2022-10-08 DIAGNOSIS — Z0000 Encounter for general adult medical examination without abnormal findings: Secondary | ICD-10-CM | POA: Diagnosis not present

## 2022-10-08 DIAGNOSIS — I10 Essential (primary) hypertension: Secondary | ICD-10-CM | POA: Diagnosis not present

## 2022-10-08 DIAGNOSIS — F418 Other specified anxiety disorders: Secondary | ICD-10-CM | POA: Diagnosis not present

## 2022-10-08 DIAGNOSIS — Z23 Encounter for immunization: Secondary | ICD-10-CM | POA: Diagnosis not present

## 2022-10-08 DIAGNOSIS — E039 Hypothyroidism, unspecified: Secondary | ICD-10-CM | POA: Diagnosis not present

## 2022-10-08 DIAGNOSIS — E559 Vitamin D deficiency, unspecified: Secondary | ICD-10-CM | POA: Diagnosis not present

## 2022-10-08 DIAGNOSIS — E785 Hyperlipidemia, unspecified: Secondary | ICD-10-CM | POA: Diagnosis not present

## 2022-10-08 DIAGNOSIS — G43909 Migraine, unspecified, not intractable, without status migrainosus: Secondary | ICD-10-CM | POA: Diagnosis not present

## 2022-10-08 DIAGNOSIS — G720 Drug-induced myopathy: Secondary | ICD-10-CM | POA: Diagnosis not present

## 2022-10-08 DIAGNOSIS — M5136 Other intervertebral disc degeneration, lumbar region: Secondary | ICD-10-CM | POA: Diagnosis not present

## 2022-10-08 DIAGNOSIS — M810 Age-related osteoporosis without current pathological fracture: Secondary | ICD-10-CM | POA: Diagnosis not present

## 2022-11-11 DIAGNOSIS — M542 Cervicalgia: Secondary | ICD-10-CM | POA: Diagnosis not present

## 2022-11-11 DIAGNOSIS — M47816 Spondylosis without myelopathy or radiculopathy, lumbar region: Secondary | ICD-10-CM | POA: Diagnosis not present

## 2022-11-12 ENCOUNTER — Ambulatory Visit (HOSPITAL_BASED_OUTPATIENT_CLINIC_OR_DEPARTMENT_OTHER): Payer: Medicare Other | Admitting: Internal Medicine

## 2022-11-15 DIAGNOSIS — G43719 Chronic migraine without aura, intractable, without status migrainosus: Secondary | ICD-10-CM | POA: Diagnosis not present

## 2022-11-23 ENCOUNTER — Encounter: Payer: Self-pay | Admitting: Internal Medicine

## 2022-11-23 ENCOUNTER — Ambulatory Visit (INDEPENDENT_AMBULATORY_CARE_PROVIDER_SITE_OTHER): Payer: Medicare Other

## 2022-11-23 VITALS — BP 166/79 | HR 64 | Temp 98.0°F | Resp 16 | Ht 62.0 in | Wt 152.0 lb

## 2022-11-23 DIAGNOSIS — E7801 Familial hypercholesterolemia: Secondary | ICD-10-CM

## 2022-11-23 MED ORDER — INCLISIRAN SODIUM 284 MG/1.5ML ~~LOC~~ SOSY: 284.0000 mg | PREFILLED_SYRINGE | Freq: Once | SUBCUTANEOUS | Status: DC

## 2022-11-23 MED ORDER — INCLISIRAN SODIUM 284 MG/1.5ML ~~LOC~~ SOSY
284.0000 mg | PREFILLED_SYRINGE | Freq: Once | SUBCUTANEOUS | Status: AC
  Administered 2022-11-23: 284 mg via SUBCUTANEOUS
  Filled 2022-11-23: qty 1.5

## 2022-11-23 NOTE — Progress Notes (Signed)
Diagnosis: Hyperlipidemia  Provider:  Praveen Mannam MD  Procedure: Injection  Leqvio (inclisiran), Dose: 284 mg, Site: subcutaneous, Number of injections: 1  Post Care: Observation period completed  Discharge: Condition: Good, Destination: Home . AVS Declined  Performed by:  Yaeko Fazekas G [place], RN       

## 2022-11-24 DIAGNOSIS — M47812 Spondylosis without myelopathy or radiculopathy, cervical region: Secondary | ICD-10-CM | POA: Diagnosis not present

## 2022-11-24 DIAGNOSIS — M47816 Spondylosis without myelopathy or radiculopathy, lumbar region: Secondary | ICD-10-CM | POA: Diagnosis not present

## 2022-11-24 DIAGNOSIS — M5126 Other intervertebral disc displacement, lumbar region: Secondary | ICD-10-CM | POA: Diagnosis not present

## 2022-11-24 DIAGNOSIS — M5020 Other cervical disc displacement, unspecified cervical region: Secondary | ICD-10-CM | POA: Diagnosis not present

## 2022-11-24 DIAGNOSIS — M542 Cervicalgia: Secondary | ICD-10-CM | POA: Diagnosis not present

## 2022-11-30 DIAGNOSIS — M542 Cervicalgia: Secondary | ICD-10-CM | POA: Diagnosis not present

## 2022-11-30 DIAGNOSIS — Z6827 Body mass index (BMI) 27.0-27.9, adult: Secondary | ICD-10-CM | POA: Diagnosis not present

## 2022-12-13 ENCOUNTER — Telehealth: Payer: Self-pay | Admitting: Internal Medicine

## 2022-12-13 ENCOUNTER — Other Ambulatory Visit: Payer: Self-pay | Admitting: Obstetrics and Gynecology

## 2022-12-13 DIAGNOSIS — Z1231 Encounter for screening mammogram for malignant neoplasm of breast: Secondary | ICD-10-CM

## 2022-12-13 NOTE — Telephone Encounter (Signed)
Pt c/o medication issue:  1. Name of Medication:   inclisiran (LEQVIO) injection 284 mg     2. How are you currently taking this medication (dosage and times per day)?   As prescribed  3. Are you having a reaction (difficulty breathing--STAT)?   Patient has 3" x 4" rash on stomach  4. What is your medication issue?   Patient stated she had a bad reaction to this medication.  Patient stated she wants to know which medication can she take for the rash.

## 2022-12-13 NOTE — Telephone Encounter (Signed)
Left message to call back  

## 2022-12-13 NOTE — Telephone Encounter (Signed)
Gave patient all information.  She states she had used an antibiotic cream because she thought it was infected.  Advised not to use the antibiotic cream as some creams can react and make the are look infected when it is not.  Advise hydrocortisone and  also avoid any bandage with adhesives In future

## 2022-12-13 NOTE — Telephone Encounter (Signed)
Pt returning call

## 2022-12-13 NOTE — Telephone Encounter (Signed)
Ok to use topical hydrocortisone cream on rash. Would rather pt take 2ng generation antihistamine like loratadine or zyrtec but can use benadryl is really itchy (but can make drowsy)

## 2022-12-13 NOTE — Telephone Encounter (Signed)
Patient states she is returning an additional call. 

## 2022-12-17 DIAGNOSIS — M542 Cervicalgia: Secondary | ICD-10-CM | POA: Diagnosis not present

## 2023-01-02 ENCOUNTER — Encounter: Payer: Self-pay | Admitting: Internal Medicine

## 2023-01-03 DIAGNOSIS — Z6827 Body mass index (BMI) 27.0-27.9, adult: Secondary | ICD-10-CM | POA: Diagnosis not present

## 2023-01-03 DIAGNOSIS — M461 Sacroiliitis, not elsewhere classified: Secondary | ICD-10-CM | POA: Diagnosis not present

## 2023-01-05 DIAGNOSIS — M542 Cervicalgia: Secondary | ICD-10-CM | POA: Diagnosis not present

## 2023-01-11 ENCOUNTER — Other Ambulatory Visit: Payer: Self-pay | Admitting: Neurological Surgery

## 2023-01-11 DIAGNOSIS — M542 Cervicalgia: Secondary | ICD-10-CM | POA: Diagnosis not present

## 2023-01-11 DIAGNOSIS — M461 Sacroiliitis, not elsewhere classified: Secondary | ICD-10-CM

## 2023-01-14 DIAGNOSIS — M542 Cervicalgia: Secondary | ICD-10-CM | POA: Diagnosis not present

## 2023-01-18 DIAGNOSIS — M542 Cervicalgia: Secondary | ICD-10-CM | POA: Diagnosis not present

## 2023-01-21 DIAGNOSIS — M542 Cervicalgia: Secondary | ICD-10-CM | POA: Diagnosis not present

## 2023-01-24 ENCOUNTER — Ambulatory Visit
Admission: RE | Admit: 2023-01-24 | Discharge: 2023-01-24 | Disposition: A | Discharge status: Home / Self Care | Payer: Medicare Other | Source: Ambulatory Visit | Attending: Neurological Surgery | Admitting: Neurological Surgery

## 2023-01-24 DIAGNOSIS — M461 Sacroiliitis, not elsewhere classified: Secondary | ICD-10-CM

## 2023-01-24 MED ORDER — IOPAMIDOL (ISOVUE-M 200) INJECTION 41%
1.0000 mL | Freq: Once | INTRAMUSCULAR | Status: AC
  Administered 2023-01-24: 1 mL via INTRA_ARTICULAR

## 2023-01-26 DIAGNOSIS — M542 Cervicalgia: Secondary | ICD-10-CM | POA: Diagnosis not present

## 2023-01-28 DIAGNOSIS — M542 Cervicalgia: Secondary | ICD-10-CM | POA: Diagnosis not present

## 2023-02-01 ENCOUNTER — Ambulatory Visit
Admission: RE | Admit: 2023-02-01 | Discharge: 2023-02-01 | Disposition: A | Discharge status: Home / Self Care | Payer: Medicare Other | Source: Ambulatory Visit | Attending: Obstetrics and Gynecology | Admitting: Obstetrics and Gynecology

## 2023-02-01 DIAGNOSIS — Z1231 Encounter for screening mammogram for malignant neoplasm of breast: Secondary | ICD-10-CM | POA: Diagnosis not present

## 2023-02-14 DIAGNOSIS — M542 Cervicalgia: Secondary | ICD-10-CM | POA: Diagnosis not present

## 2023-02-21 ENCOUNTER — Telehealth: Payer: Self-pay | Admitting: Internal Medicine

## 2023-02-21 DIAGNOSIS — E039 Hypothyroidism, unspecified: Secondary | ICD-10-CM

## 2023-02-21 DIAGNOSIS — E7801 Familial hypercholesterolemia: Secondary | ICD-10-CM

## 2023-02-21 NOTE — Telephone Encounter (Signed)
Patient would like to have her thyroid panel done when Lipid.  Can we order the panel or will she have to have an order from PCP? Please advise

## 2023-02-21 NOTE — Telephone Encounter (Signed)
Patient would like to know if she can have a thyroid panel ordered for when she comes in for her lipid so that she doesn't have to go to 2 offices for lab work.

## 2023-02-23 DIAGNOSIS — M461 Sacroiliitis, not elsewhere classified: Secondary | ICD-10-CM | POA: Diagnosis not present

## 2023-02-23 DIAGNOSIS — Z6828 Body mass index (BMI) 28.0-28.9, adult: Secondary | ICD-10-CM | POA: Diagnosis not present

## 2023-02-25 NOTE — Addendum Note (Signed)
Addended by: Lindell Spar on: 02/25/2023 08:16 AM   Modules accepted: Orders

## 2023-02-25 NOTE — Telephone Encounter (Signed)
Update on labs sent via MyChart message to patient

## 2023-02-28 DIAGNOSIS — M542 Cervicalgia: Secondary | ICD-10-CM | POA: Diagnosis not present

## 2023-03-03 ENCOUNTER — Encounter: Payer: Self-pay | Admitting: Internal Medicine

## 2023-03-03 DIAGNOSIS — E7801 Familial hypercholesterolemia: Secondary | ICD-10-CM | POA: Diagnosis not present

## 2023-03-03 DIAGNOSIS — E039 Hypothyroidism, unspecified: Secondary | ICD-10-CM | POA: Diagnosis not present

## 2023-03-04 LAB — NMR, LIPOPROFILE
Cholesterol, Total: 209 mg/dL — ABNORMAL HIGH (ref 100–199)
HDL Particle Number: 46.4 umol/L (ref >=30.5)
HDL-C: 78 mg/dL (ref >39)
LDL-C (NIH Calc): 108 mg/dL — ABNORMAL HIGH (ref 0–99)
LP-IR Score: <25 (ref <=45)
Small LDL Particle Number: 388 nmol/L (ref <=527)
Triglycerides: 133 mg/dL (ref 0–149)

## 2023-03-04 LAB — LIPOPROTEIN A (LPA)

## 2023-03-05 LAB — NMR, LIPOPROFILE
LDL Particle Number: 1225 nmol/L — ABNORMAL HIGH (ref <1000)
LDL Size: 21.3 nm (ref >20.5)

## 2023-03-05 LAB — TSH+FREE T4
Free T4: 1.75 ng/dL (ref 0.82–1.77)
TSH: 1.25 u[IU]/mL (ref 0.450–4.500)

## 2023-03-07 DIAGNOSIS — M542 Cervicalgia: Secondary | ICD-10-CM | POA: Diagnosis not present

## 2023-03-08 ENCOUNTER — Ambulatory Visit (INDEPENDENT_AMBULATORY_CARE_PROVIDER_SITE_OTHER): Payer: Medicare Other | Admitting: Internal Medicine

## 2023-03-08 ENCOUNTER — Encounter (HOSPITAL_BASED_OUTPATIENT_CLINIC_OR_DEPARTMENT_OTHER): Payer: Self-pay | Admitting: Internal Medicine

## 2023-03-08 VITALS — BP 165/79 | HR 67 | Ht 62.0 in | Wt 157.0 lb

## 2023-03-08 DIAGNOSIS — E7801 Familial hypercholesterolemia: Secondary | ICD-10-CM | POA: Diagnosis not present

## 2023-03-08 DIAGNOSIS — T466X5D Adverse effect of antihyperlipidemic and antiarteriosclerotic drugs, subsequent encounter: Secondary | ICD-10-CM | POA: Diagnosis not present

## 2023-03-08 DIAGNOSIS — M791 Myalgia, unspecified site: Secondary | ICD-10-CM | POA: Diagnosis not present

## 2023-03-08 NOTE — Progress Notes (Signed)
LIPID CLINIC CONSULT NOTE  Chief Complaint:  Manage dyslipidemia  Primary Care Physician: Laurann Montana, MD  Primary Cardiologist:  None  HPI:  Nancy Anthony is a 72 y.o. female who is being seen today for the evaluation of dyslipidemia at the request of Laurann Montana, MD. This is a pleasant 72 yo female who is referred for dyslipidemia and statin intolerance.  Recent lipid showed total cholesterol 292, triglycerides 138, HDL 82 and LDL 185.  She does report history of high cholesterol and her sister as well as her brother however he died of prostate cancer.  She did say that he had bilateral carotid artery disease and her father also died of an MI at age 68.  She does have a history of some aortic calcification noted on CT abdomen was performed in January of this year after surgery for L2 kyphoplasty which incidentally showed cement extending into the right paravertebral veins and into the IVC with thrombus noted.  This required urgent vascular surgery to correct.  She also has a history of statin intolerance having tried several statins which caused muscle aches.  She reports intolerance to citric acid and MSG which worsen her headaches.  03/08/2023  Nancy Anthony returns today for follow-up.  She initially was started on Leqvio and had an injection earlier this year which was tolerated very well.  Unfortunately her second injection however because injection site redness which spread, blistered and she said was swollen.  She also complained of other concerns including muscle spasms, increased appetite, headache, rash and other muscle soreness which she felt was all related.  She is now said that she no longer wants to take the medication.  We did perform a coronary calcium score which showed a small amount of coronary calcium of 2.59 only 42nd percentile for age and sex matched controls.  PMHx:  Past Medical History:  Diagnosis Date   Anemia    Hashimoto's disease    72 years old    Hyperlipidemia    Hypothyroidism    Migraine    Pneumonia    Ruptured disk    Thyroid disease     Past Surgical History:  Procedure Laterality Date   BREAST BIOPSY     BREAST EXCISIONAL BIOPSY Right 25+ yrs ago   benign   BREAST EXCISIONAL BIOPSY Left 25+ yrs ago   benign   CHOLECYSTECTOMY N/A 11/15/2017   Procedure: LAPAROSCOPIC CHOLECYSTECTOMY WITH INTRAOPERATIVE CHOLANGIOGRAM ERAS PATHWAY;  Surgeon: Manus Rudd, MD;  Location: [facility] OR;  Service: General;  Laterality: N/A;   COLONOSCOPY     CYST REMOVAL L EAR Left 1997   DIAGNOSTIC LAPAROSCOPY     KYPHOPLASTY N/A 09/03/2021   Procedure: LUMBAR 2 KYPHOPLASTY;  Surgeon: Estill Bamberg, MD;  Location: [facility] OR;  Service: Orthopedics;  Laterality: N/A;   THROMBECTOMY ILIAC ARTERY Right 09/14/2021   Procedure: THROMBECTOMY PULMONARY ARTERY;  Surgeon: Victorino Sparrow, MD;  Location: [facility] OR;  Service: Vascular;  Laterality: Right;   ULTRASOUND GUIDANCE FOR VASCULAR ACCESS Bilateral 09/14/2021   Procedure: ULTRASOUND GUIDANCE FOR VASCULAR ACCESS;  Surgeon: Victorino Sparrow, MD;  Location: [facility] OR;  Service: Vascular;  Laterality: Bilateral;    FAMHx:  Family History  Problem Relation Age of Onset   Hyperthyroidism Mother    Hypothyroidism Father    Migraines Father    Prostate cancer Brother    Hypothyroidism Brother     SOCHx:   reports that she quit smoking about 34 years ago. Her smoking  use included cigarettes. She has never used smokeless tobacco. She reports that she does not drink alcohol and does not use drugs.  ALLERGIES:  Allergies  Allergen Reactions   Citric Acid     migraines   Codeine Nausea And Vomiting    Headache & shakes   Other Nausea And Vomiting    MSG - migraines   Statins     Muscle weakness, tolerates low doses of crestor   Tape Hives   Epinephrine Palpitations   Sulfa Antibiotics Nausea And Vomiting and Rash    ROS: Pertinent items noted in HPI and remainder of comprehensive ROS otherwise  negative.  HOME MEDS: Current Outpatient Medications on File Prior to Visit  Medication Sig Dispense Refill   acetaminophen (TYLENOL) 500 MG tablet Take 1,000 mg by mouth every 6 (six) hours as needed for moderate pain.     aspirin EC 81 MG tablet Take 81 mg by mouth daily. Swallow whole.     Calcium-Magnesium-Vitamin D (CALCIUM MAGNESIUM PO) Take 1 tablet by mouth daily.     Cholecalciferol (VITAMIN D) 125 MCG (5000 UT) CAPS Take 5,000 Units by mouth daily.     EMGALITY 120 MG/ML SOAJ Inject into the skin every 30 (thirty) days.     Folic Acid-Vit B6-Vit B12 (FOLBEE) 2.5-25-1 MG TABS tablet Take 1 tablet by mouth daily.     frovatriptan (FROVA) 2.5 MG tablet Take 2.5 mg by mouth as needed for migraine.     NYSTATIN powder Apply 1 application topically 2 (two) times daily as needed.     perphenazine (TRILAFON) 4 MG tablet Take 2 mg by mouth as needed for migraine. PRN every 2 hours     promethazine (PHENERGAN) 25 MG suppository Place 25-50 mg rectally as needed for nausea or vomiting.      SYNTHROID 100 MCG tablet Take 100 mcg by mouth every morning.     No current facility-administered medications on file prior to visit.    LABS/IMAGING: No results found for this or any previous visit (from the past 48 hour(s)). No results found.  LIPID PANEL: No results found for: "CHOL", "TRIG", "HDL", "CHOLHDL", "VLDL", "LDLCALC", "LDLDIRECT"  WEIGHTS: Wt Readings from Last 3 Encounters:  03/08/23 157 lb (71.2 kg)  11/23/22 152 lb (68.9 kg)  08/24/22 152 lb (68.9 kg)    VITALS: BP (!) 165/79 (BP Location: Left Arm, Patient Position: Sitting, Cuff Size: Normal)   Pulse 67   Ht 5\' 2"  (1.575 m)   Wt 157 lb (71.2 kg)   SpO2 98%   BMI 28.72 kg/m   EXAM: Deferred  EKG: Deferred  ASSESSMENT: Possible familial hyperlipidemia, Simon Broome criteria First-degree relatives with high cholesterol and early onset heart disease Statin intolerant-myalgias  PLAN: 1.   Nancy Anthony  unfortunately seems to have had a reaction to Leqvio.  This was not in the initial injection but the second injection might be delayed hypersensitivity.  Nonetheless she is no longer interested in taking the medication.  We discussed some other possible options that she cannot take statins but she is really not interested in additional medications at this time.  I did go over her calcium score which showed some coronary artery disease although less than expected for her age.  Based on this she feels some reassurance and asked about what options she may have for surveying this.  I suggested a potential repeat calcium score in 5 years to see if her coronary calcium has progressed to a percentile level that  was higher comparatively to age and sex matched controls.  She wants to proceed with this and will likely arrange to have that testing done through her primary care provider.  Follow-up with me as needed.  Chrystie Nose, MD, [facility], FACP  [place]  [facility]  Medical Director of the Advanced Lipid Disorders &  Cardiovascular Risk Reduction Clinic Diplomate of the American Board of Clinical Lipidology Attending Cardiologist  Direct Dial: [phone]  Fax: [phone]  Website:  [email or web address] Nicely Pratham Cassatt 03/08/2023, 11:57 AM

## 2023-03-08 NOTE — Patient Instructions (Signed)
Medication Instructions:  Your physician recommends that you continue on your current medications as directed. Please refer to the Current Medication list given to you today.  *If you need a refill on your cardiac medications before your next appointment, please call your pharmacy*  Follow-Up: At [facility], you and your health needs are our priority.  As part of our continuing mission to provide you with exceptional heart care, we have created designated Provider Care Teams.  These Care Teams include your primary Cardiologist (physician) and Advanced Practice Providers (APPs -  Physician Assistants and Nurse Practitioners) who all work together to provide you with the care you need, when you need it.  We recommend signing up for the patient portal called "MyChart".  Sign up information is provided on this After Visit Summary.  MyChart is used to connect with patients for Virtual Visits (Telemedicine).  Patients are able to view lab/test results, encounter notes, upcoming appointments, etc.  Non-urgent messages can be sent to your provider as well.   To learn more about what you can do with MyChart, go to [email or web address].    Your next appointment:   Call us if you need us!   

## 2023-03-09 DIAGNOSIS — M461 Sacroiliitis, not elsewhere classified: Secondary | ICD-10-CM | POA: Diagnosis not present

## 2023-03-18 DIAGNOSIS — M542 Cervicalgia: Secondary | ICD-10-CM | POA: Diagnosis not present

## 2023-03-28 DIAGNOSIS — M542 Cervicalgia: Secondary | ICD-10-CM | POA: Diagnosis not present

## 2023-04-11 DIAGNOSIS — M461 Sacroiliitis, not elsewhere classified: Secondary | ICD-10-CM | POA: Diagnosis not present

## 2023-04-12 DIAGNOSIS — Z7189 Other specified counseling: Secondary | ICD-10-CM | POA: Diagnosis not present

## 2023-04-18 ENCOUNTER — Other Ambulatory Visit: Payer: Self-pay | Admitting: Neurological Surgery

## 2023-04-18 DIAGNOSIS — M461 Sacroiliitis, not elsewhere classified: Secondary | ICD-10-CM

## 2023-04-19 DIAGNOSIS — I872 Venous insufficiency (chronic) (peripheral): Secondary | ICD-10-CM | POA: Diagnosis not present

## 2023-04-19 DIAGNOSIS — D692 Other nonthrombocytopenic purpura: Secondary | ICD-10-CM | POA: Diagnosis not present

## 2023-04-19 DIAGNOSIS — I8311 Varicose veins of right lower extremity with inflammation: Secondary | ICD-10-CM | POA: Diagnosis not present

## 2023-04-19 DIAGNOSIS — I8312 Varicose veins of left lower extremity with inflammation: Secondary | ICD-10-CM | POA: Diagnosis not present

## 2023-05-28 IMAGING — CT CT ABD-PELV W/ CM
2 of 5 series · 16 of 46 positions shown, 18 images · IV contrast (APPLIED)
Comparison: None.

CLINICAL DATA: Abdominal pain, acute, nonlocalized

EXAM:
CT ABDOMEN AND PELVIS WITH CONTRAST
TECHNIQUE: Multidetector CT imaging of the abdomen and pelvis was performed
using the standard protocol following bolus administration of
intravenous contrast.
CONTRAST:  100mL OMNIPAQUE IOHEXOL 300 MG/ML  SOLN

[Series 2: abd pel w · axial · 0.69mm/px · z∈[-438,-18]mm · 13 of 94 slices shown, 15 images]
[im 5/94  soft-tissue]
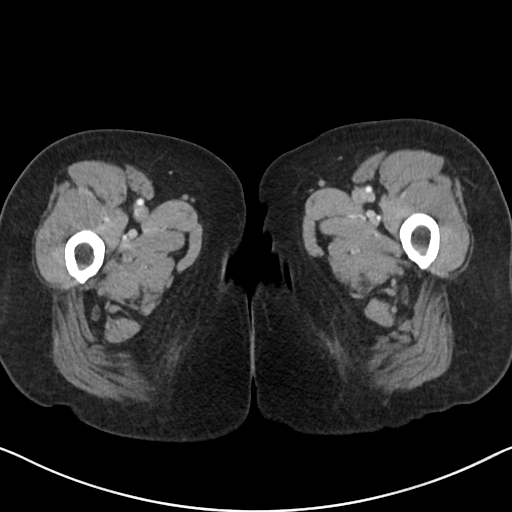
[im 5/94  bone]
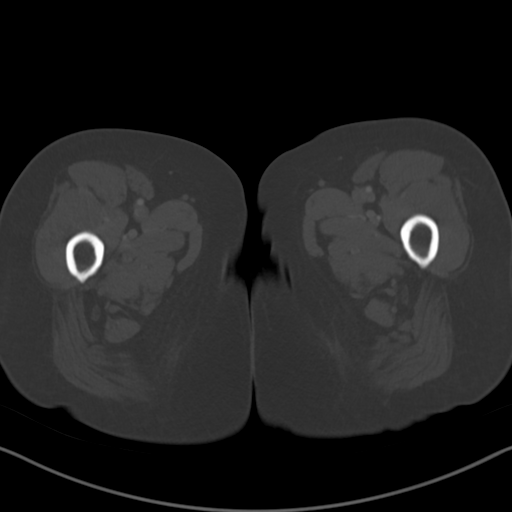
[im 14/94  soft-tissue]
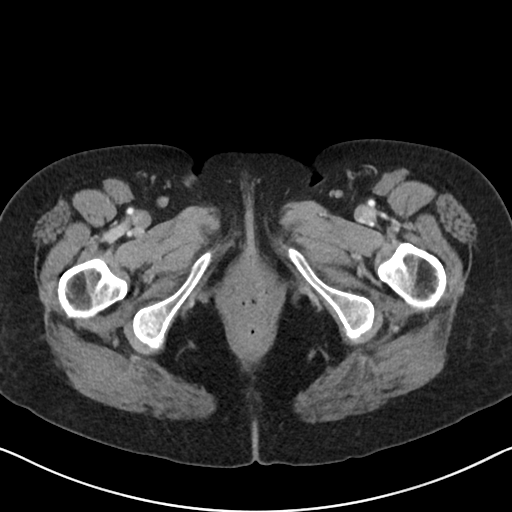
[im 19/94  soft-tissue]
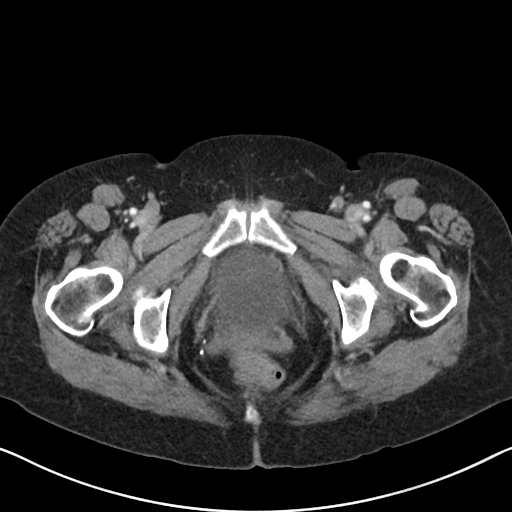
[im 28/94  soft-tissue]
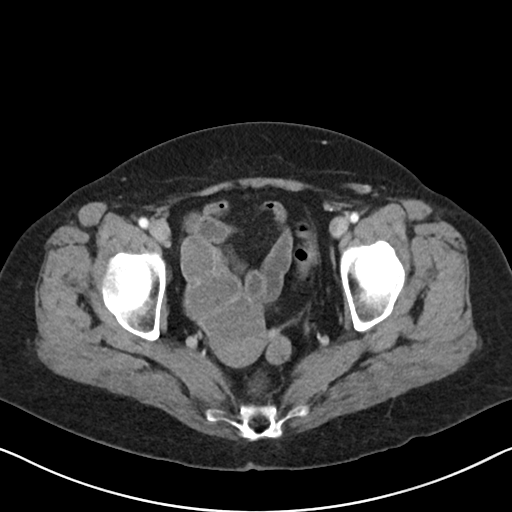
[im 33/94  soft-tissue]
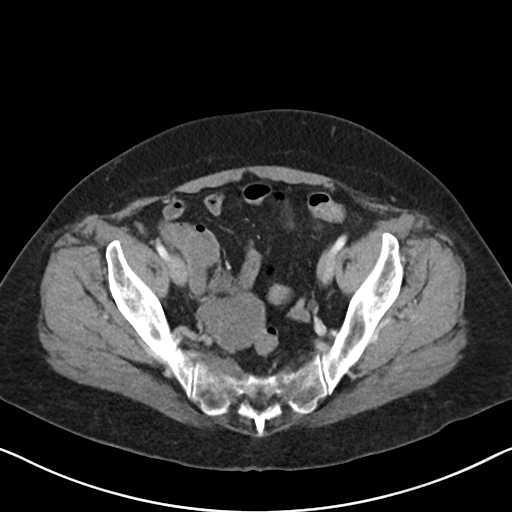
[im 42/94  soft-tissue]
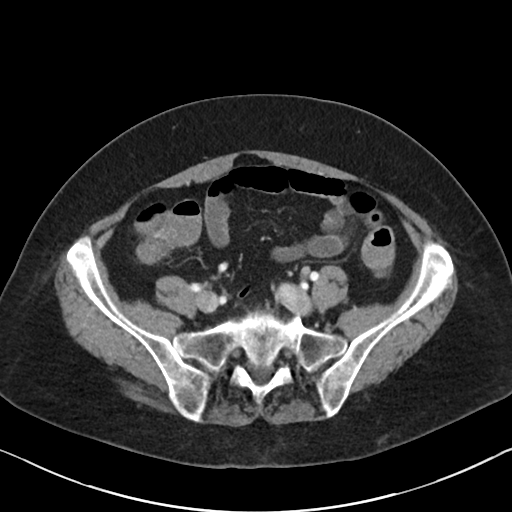
[im 47/94  soft-tissue]
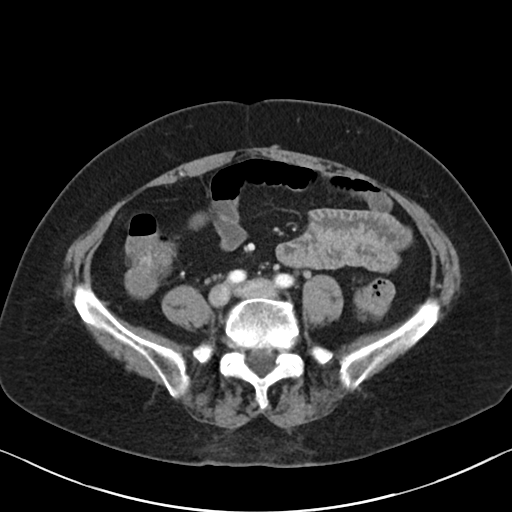
[im 52/94  soft-tissue]
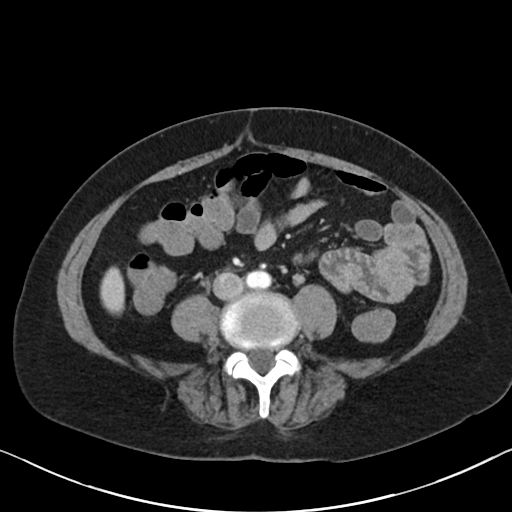
[im 61/94  soft-tissue]
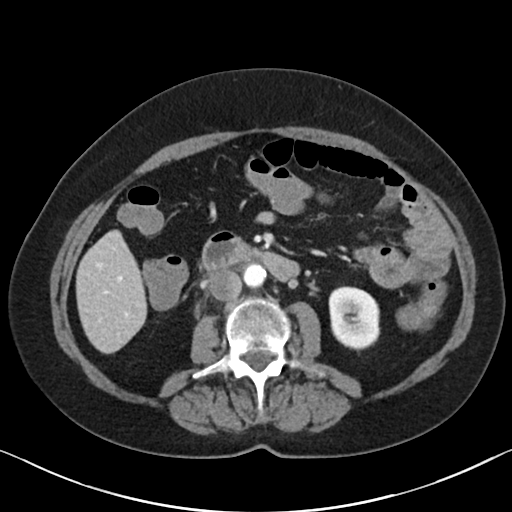
[im 61/94  bone]
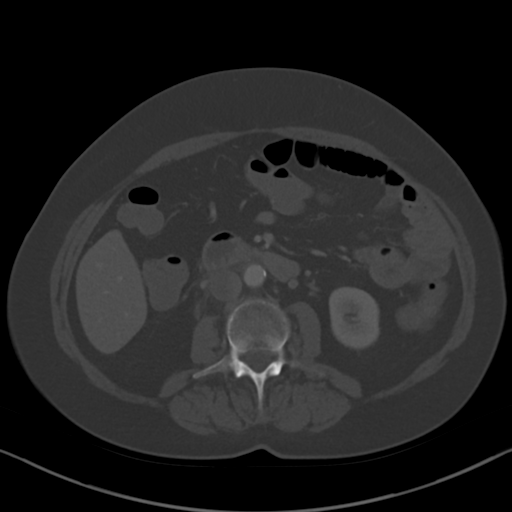
[im 66/94  soft-tissue]
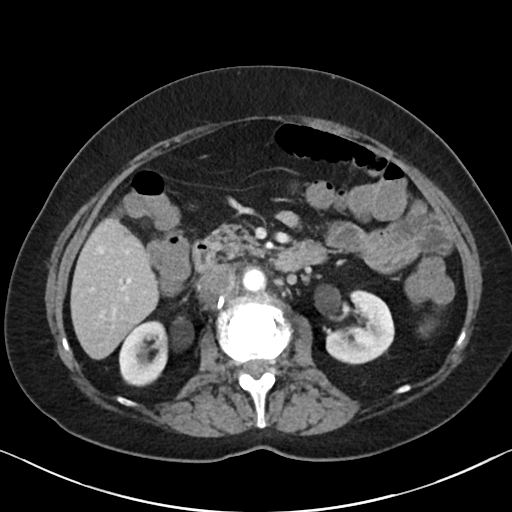
[im 75/94  soft-tissue]
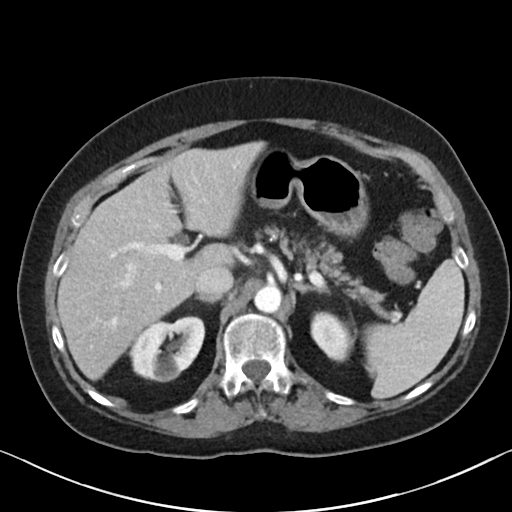
[im 80/94  soft-tissue]
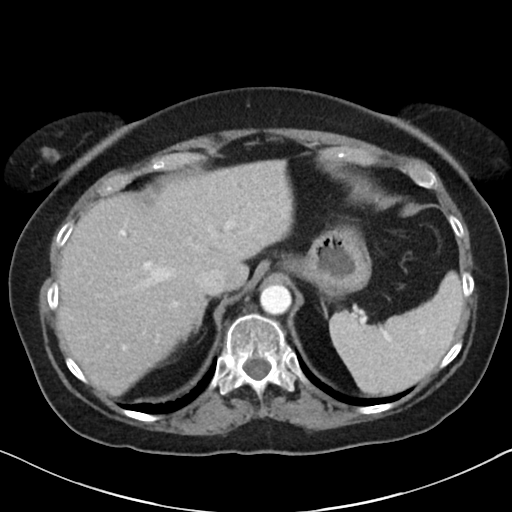
[im 89/94  soft-tissue]
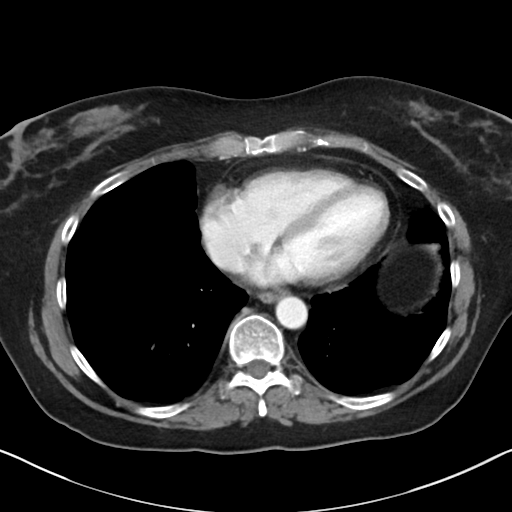

[Series 5: coronal · coronal · 0.74mm/px · 3 of 94 slices shown]
[im 32/94  soft-tissue]
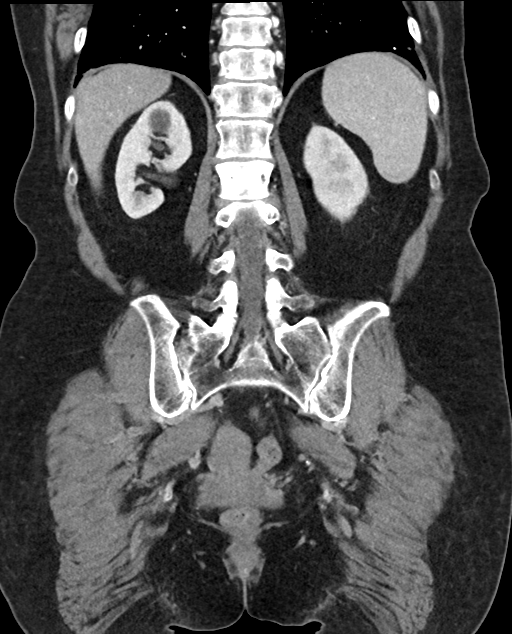
[im 42/94  soft-tissue]
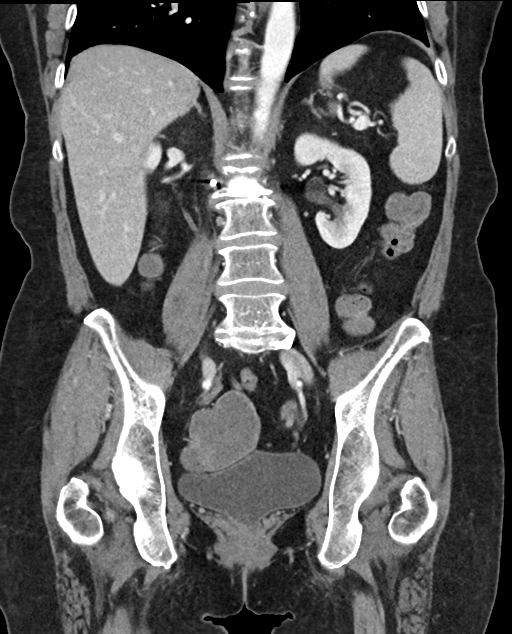
[im 52/94  soft-tissue]
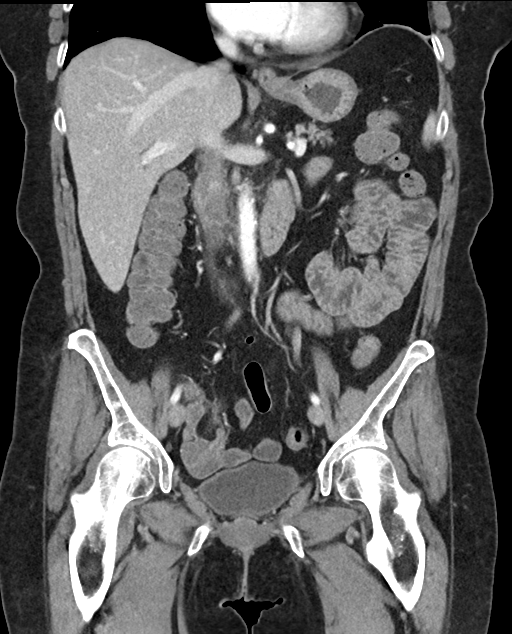

[16 of 46 positions shown; findings below may reference images not displayed]

FINDINGS: Lower chest: Lung bases are clear. No effusions. Heart is normal
size.

Hepatobiliary: No focal liver abnormality is seen. Status post
cholecystectomy. No biliary dilatation.

Pancreas: No focal abnormality or ductal dilatation.

Spleen: No focal abnormality.  Normal size.

Adrenals/Urinary Tract: Right upper pole renal cyst appears benign.
No hydronephrosis. Adrenal glands and urinary bladder

Stomach/Bowel: Stomach, large and small bowel grossly unremarkable.

Vascular/Lymphatic: Aortic calcifications. No aneurysm. Cement from
the L2 kyphoplasty extends in right paravertebral veins and into the
IVC with thrombus seen around the cement in the IVC and extending
inferiorly in the IVC.

Reproductive: Uterus and adnexa unremarkable.  No mass.

Other: No free fluid or free air.

Musculoskeletal: No acute bony abnormality. Kyphoplasty changes
noted at L2.
IMPRESSION: Prior L2 kyphoplasty. Cement extends in right paravertebral veins
and into the IVC with thrombus noted in the infrarenal IVC.

These results were called by telephone at the time of interpretation
on 09/12/2021 at [DATE] to provider Dr. Zoe, Who verbally
acknowledged these results.

## 2023-05-30 DIAGNOSIS — G43719 Chronic migraine without aura, intractable, without status migrainosus: Secondary | ICD-10-CM | POA: Diagnosis not present

## 2023-06-06 ENCOUNTER — Ambulatory Visit
Admission: RE | Admit: 2023-06-06 | Discharge: 2023-06-06 | Disposition: A | Discharge status: Home / Self Care | Payer: Medicare Other | Source: Ambulatory Visit | Attending: Neurological Surgery

## 2023-06-06 DIAGNOSIS — M461 Sacroiliitis, not elsewhere classified: Secondary | ICD-10-CM

## 2023-06-06 MED ORDER — METHYLPREDNISOLONE ACETATE 40 MG/ML INJ SUSP (RADIOLOG
80.0000 mg | Freq: Once | INTRAMUSCULAR | Status: AC
  Administered 2023-06-06: 80 mg via INTRA_ARTICULAR

## 2023-06-06 MED ORDER — IOPAMIDOL (ISOVUE-M 200) INJECTION 41%
1.0000 mL | Freq: Once | INTRAMUSCULAR | Status: AC
  Administered 2023-06-06: 1 mL via INTRA_ARTICULAR

## 2023-07-26 DIAGNOSIS — Z23 Encounter for immunization: Secondary | ICD-10-CM | POA: Diagnosis not present

## 2023-09-12 DIAGNOSIS — Z01419 Encounter for gynecological examination (general) (routine) without abnormal findings: Secondary | ICD-10-CM | POA: Diagnosis not present

## 2023-09-12 DIAGNOSIS — N958 Other specified menopausal and perimenopausal disorders: Secondary | ICD-10-CM | POA: Diagnosis not present

## 2023-09-12 DIAGNOSIS — M816 Localized osteoporosis [Lequesne]: Secondary | ICD-10-CM | POA: Diagnosis not present

## 2023-09-12 DIAGNOSIS — Z6829 Body mass index (BMI) 29.0-29.9, adult: Secondary | ICD-10-CM | POA: Diagnosis not present

## 2023-10-07 ENCOUNTER — Other Ambulatory Visit: Payer: Self-pay | Admitting: Surgery

## 2023-10-07 DIAGNOSIS — M461 Sacroiliitis, not elsewhere classified: Secondary | ICD-10-CM

## 2023-10-19 ENCOUNTER — Telehealth: Payer: Self-pay

## 2023-10-19 NOTE — Telephone Encounter (Signed)
Auth Submission: NO AUTH NEEDED Payer: MEDICARE A/B & BCBS SUPP Medication & CPT/J Code(s) submitted: Leqvio (Inclisiran) [phone] Route of submission (phone, fax, portal):  Phone # Fax # Auth type: Buy/Bill Units/visits requested: 284mg  x 2 doses Reference number:  Approval from: 08/17/22 to 10/06/24

## 2023-10-21 DIAGNOSIS — Z86711 Personal history of pulmonary embolism: Secondary | ICD-10-CM | POA: Diagnosis not present

## 2023-10-21 DIAGNOSIS — E559 Vitamin D deficiency, unspecified: Secondary | ICD-10-CM | POA: Diagnosis not present

## 2023-10-21 DIAGNOSIS — M51362 Other intervertebral disc degeneration, lumbar region with discogenic back pain and lower extremity pain: Secondary | ICD-10-CM | POA: Diagnosis not present

## 2023-10-21 DIAGNOSIS — Z860101 Personal history of adenomatous and serrated colon polyps: Secondary | ICD-10-CM | POA: Diagnosis not present

## 2023-10-21 DIAGNOSIS — G720 Drug-induced myopathy: Secondary | ICD-10-CM | POA: Diagnosis not present

## 2023-10-21 DIAGNOSIS — I10 Essential (primary) hypertension: Secondary | ICD-10-CM | POA: Diagnosis not present

## 2023-10-21 DIAGNOSIS — Z0000 Encounter for general adult medical examination without abnormal findings: Secondary | ICD-10-CM | POA: Diagnosis not present

## 2023-10-21 DIAGNOSIS — E039 Hypothyroidism, unspecified: Secondary | ICD-10-CM | POA: Diagnosis not present

## 2023-10-21 DIAGNOSIS — M810 Age-related osteoporosis without current pathological fracture: Secondary | ICD-10-CM | POA: Diagnosis not present

## 2023-10-21 DIAGNOSIS — G43909 Migraine, unspecified, not intractable, without status migrainosus: Secondary | ICD-10-CM | POA: Diagnosis not present

## 2023-10-21 DIAGNOSIS — E785 Hyperlipidemia, unspecified: Secondary | ICD-10-CM | POA: Diagnosis not present

## 2023-10-21 DIAGNOSIS — F418 Other specified anxiety disorders: Secondary | ICD-10-CM | POA: Diagnosis not present

## 2023-10-28 ENCOUNTER — Inpatient Hospital Stay: Admission: RE | Admit: 2023-10-28 | Payer: Medicare Other | Source: Ambulatory Visit

## 2023-10-28 ENCOUNTER — Other Ambulatory Visit: Payer: Medicare Other

## 2023-11-25 ENCOUNTER — Other Ambulatory Visit: Payer: Medicare Other

## 2023-11-25 DIAGNOSIS — M461 Sacroiliitis, not elsewhere classified: Secondary | ICD-10-CM | POA: Diagnosis not present

## 2023-11-25 DIAGNOSIS — Z6829 Body mass index (BMI) 29.0-29.9, adult: Secondary | ICD-10-CM | POA: Diagnosis not present

## 2023-11-26 ENCOUNTER — Other Ambulatory Visit: Payer: Self-pay | Admitting: Neurological Surgery

## 2023-11-26 DIAGNOSIS — M461 Sacroiliitis, not elsewhere classified: Secondary | ICD-10-CM

## 2023-12-05 ENCOUNTER — Encounter: Payer: Self-pay | Admitting: Primary Care

## 2023-12-05 ENCOUNTER — Other Ambulatory Visit

## 2023-12-05 ENCOUNTER — Ambulatory Visit (INDEPENDENT_AMBULATORY_CARE_PROVIDER_SITE_OTHER)

## 2023-12-05 ENCOUNTER — Inpatient Hospital Stay: Admission: RE | Admit: 2023-12-05 | Source: Ambulatory Visit

## 2023-12-05 ENCOUNTER — Ambulatory Visit (INDEPENDENT_AMBULATORY_CARE_PROVIDER_SITE_OTHER): Payer: Medicare Other | Admitting: Primary Care

## 2023-12-05 VITALS — BP 156/76 | HR 77 | Temp 97.3°F | Ht 62.0 in | Wt 161.6 lb

## 2023-12-05 DIAGNOSIS — G43719 Chronic migraine without aura, intractable, without status migrainosus: Secondary | ICD-10-CM | POA: Diagnosis not present

## 2023-12-05 DIAGNOSIS — I82220 Acute embolism and thrombosis of inferior vena cava: Secondary | ICD-10-CM

## 2023-12-05 DIAGNOSIS — Z0389 Encounter for observation for other suspected diseases and conditions ruled out: Secondary | ICD-10-CM | POA: Diagnosis not present

## 2023-12-05 NOTE — Progress Notes (Signed)
 @Patient  ID: Nancy Anthony, female    DOB: 1950-12-24, 73 y.o.   MRN: [id number]  Chief Complaint  Patient presents with   Follow-up    F/U on Pulmonary Embolism per PCP    Referring provider: Laurann Montana, MD  HPI: 73 year old female, former smoker. PMH significant for IVC thrombosis, hypothyroidism, dyslipidemia.   12/05/2023 Discussed the use of AI scribe software for clinical note transcription with the patient, who gave verbal consent to proceed.  History of Present Illness   Nancy Anthony is a 73 year old female who presents for follow-up regarding pulmonary health. She was referred by her daughter's friend, a PA, to establish care with a pulmonologist.  She has a history of a pulmonary embolism that occurred two years ago following a fall and subsequent kyphoplasty for a vertebral body fracture. During the procedure, there was a partial occlusion of the inferior vena cava and embolic disease into the right pulmonary artery due to bone cement embolization. She underwent endovascular removal of the cement from the inferior vena cava, which was successful. Post-procedure, she was placed on Eliquis for six months, followed by a transition to aspirin 81 mg daily.   Currently, she experiences mild shortness of breath, which she attributes to a lack of exercise due to exacerbation of back pain with physical activity. No chest pain or significant chest discomfort.   Regarding her back, the site of the kyphoplasty is generally well, with occasional 'hitch' sensations but no significant pain. She experiences lower back pain and receives sacroiliac joint injections, with the next one scheduled for Friday.  She monitors her blood pressure at home, noting it is typically around 138 mmHg, and is not on any antihypertensive medication.     Allergies  Allergen Reactions   Leqvio [Inclisiran Sodium] Swelling   Citric Acid     migraines   Codeine Nausea And Vomiting    Headache &  shakes   Other Nausea And Vomiting    MSG - migraines   Statins     Muscle weakness, tolerates low doses of crestor   Tape Hives   Epinephrine Palpitations   Sulfa Antibiotics Nausea And Vomiting and Rash    Immunization History  Administered Date(s) Administered   Influenza Split 06/06/2014, 08/14/2015, 05/14/2017, 08/23/2018, 08/23/2019, 05/08/2020   Influenza, High Dose Seasonal PF 06/04/2019   Influenza,inj,Quad PF,6+ Mos 07/01/2016   PFIZER(Purple Top)SARS-COV-2 Vaccination 09/29/2019, 10/21/2019   Pneumococcal Conjugate-13 07/05/2017   Pneumococcal Polysaccharide-23 08/23/2019   Td 10/20/2000   Tdap 12/01/2006, 07/01/2016   Zoster, Live 12/17/2011, 05/14/2017, 09/30/2017    Past Medical History:  Diagnosis Date   Anemia    Hashimoto's disease    73 years old   Hyperlipidemia    Hypothyroidism    Migraine    Pneumonia    Ruptured disk    Thyroid disease     Tobacco History: Social History   Tobacco Use  Smoking Status Former   Current packs/day: 0.00   Types: Cigarettes   Quit date: 12/1988   Years since quitting: 35.0  Smokeless Tobacco Never   Counseling given: Not Answered   Outpatient Medications Prior to Visit  Medication Sig Dispense Refill   acetaminophen (TYLENOL) 500 MG tablet Take 1,000 mg by mouth every 6 (six) hours as needed for moderate pain.     aspirin EC 81 MG tablet Take 81 mg by mouth daily. Swallow whole.     Calcium-Magnesium-Vitamin D (CALCIUM MAGNESIUM PO) Take 1 tablet by  mouth daily.     Cholecalciferol (VITAMIN D) 125 MCG (5000 UT) CAPS Take 5,000 Units by mouth daily.     cyclobenzaprine (FLEXERIL) 10 MG tablet Take 10 mg by mouth as needed for muscle spasms (1-2 tablets by mouth prn).     EMGALITY 120 MG/ML SOAJ Inject into the skin every 30 (thirty) days.     Folic Acid-Vit B6-Vit B12 (FOLBEE) 2.5-25-1 MG TABS tablet Take 1 tablet by mouth daily.     frovatriptan (FROVA) 2.5 MG tablet Take 2.5 mg by mouth as needed for  migraine.     NYSTATIN powder Apply 1 application topically 2 (two) times daily as needed.     perphenazine (TRILAFON) 4 MG tablet Take 2 mg by mouth as needed for migraine. PRN every 2 hours     SYNTHROID 100 MCG tablet Take 100 mcg by mouth every morning.     promethazine (PHENERGAN) 25 MG suppository Place 25-50 mg rectally as needed for nausea or vomiting.  (Patient not taking: Reported on 12/05/2023)     No facility-administered medications prior to visit.   Review of Systems  Review of Systems  Constitutional: Negative.   HENT: Negative.    Respiratory:  Positive for shortness of breath. Negative for chest tightness and wheezing.        Mild SOB with exertion  Cardiovascular:  Negative for chest pain.    Physical Exam  BP (!) 156/76 (BP Location: Left Arm, Patient Position: Sitting, Cuff Size: Large)   Pulse 77   Temp (!) 97.3 F (36.3 C) (Temporal)   Ht 5\' 2"  (1.575 m)   Wt 161 lb 9.6 oz (73.3 kg)   SpO2 97%   BMI 29.56 kg/m  Physical Exam Constitutional:      General: She is not in acute distress.    Appearance: Normal appearance. She is not ill-appearing.  HENT:     Head: Normocephalic and atraumatic.  Cardiovascular:     Rate and Rhythm: Normal rate and regular rhythm.  Pulmonary:     Effort: Pulmonary effort is normal.     Breath sounds: Normal breath sounds. No wheezing, rhonchi or rales.  Musculoskeletal:        General: Normal range of motion.  Skin:    General: Skin is warm and dry.  Neurological:     General: No focal deficit present.     Mental Status: She is alert and oriented to person, place, and time. Mental status is at baseline.  Psychiatric:        Mood and Affect: Mood normal.        Behavior: Behavior normal.        Thought Content: Thought content normal.        Judgment: Judgment normal.      Lab Results:  CBC    Component Value Date/Time   WBC 8.8 09/15/2021 0403   RBC 3.31 (L) 09/15/2021 0403   HGB 9.7 (L) 09/15/2021 0403    HCT 28.8 (L) 09/15/2021 0403   PLT 240 09/15/2021 0403   MCV 87.0 09/15/2021 0403   MCH 29.3 09/15/2021 0403   MCHC 33.7 09/15/2021 0403   RDW 12.6 09/15/2021 0403   LYMPHSABS 1.2 08/27/2021 1018   MONOABS 0.6 08/27/2021 1018   EOSABS 0.2 08/27/2021 1018   BASOSABS 0.1 08/27/2021 1018    BMET    Component Value Date/Time   NA 139 09/12/2021 1109   K 4.2 09/12/2021 1109   CL 106 09/12/2021 1109   CO2  21 (L) 09/12/2021 1109   GLUCOSE 97 09/12/2021 1109   BUN 7 (L) 09/12/2021 1109   CREATININE 0.73 09/12/2021 1109   CALCIUM 8.7 (L) 09/12/2021 1109   GFRNONAA >60 09/12/2021 1109   GFRAA >60 10/29/2017 0733    BNP No results found for: "BNP"  ProBNP No results found for: "PROBNP"  Imaging: DG Chest 2 View Result Date: 12/05/2023 CLINICAL DATA:  Pulmonary embolism. EXAM: CHEST - 2 VIEW COMPARISON:  09/22/2021 x-ray FINDINGS: No consolidation, pneumothorax or effusion. No edema. Normal cardiopericardial silhouette. Augmentation cement again seen along upper lumbar spine. Pulmonary arterial cement again suggested at the right hilum. Please correlate with history and dedicated imaging. IMPRESSION: No significant interval change. Electronically Signed   By: Karen Kays M.D.   On: 12/05/2023 16:31     Assessment & Plan:   1. IVC thrombosis (HCC) (Primary) - DG Chest 2 View; Future  Assessment and Plan    Pulmonary embolism with bone cement embolization Pulmonary embolism secondary to bone cement embolization post-kyphoplasty, involving the right inferior pulmonary artery. Completed six-month anticoagulation with Eliquis, now on aspirin 81 mg daily. No significant chest pain or worsening dyspnea, suggesting no embolic recurrence. Dyspnea likely due to deconditioning. Lesion retrieval not advised due to potential pulmonary vascular complications. - Order chest x-ray to evaluate pulmonary artery status - Consider CT scan if chest x-ray abnormalities or symptoms develop  Back  pain post-kyphoplasty Intermittent back pain likely related to previous kyphoplasty. Receives SI joint injections for pain management. - Continue SI joint injections as scheduled  Hypertension Elevated blood pressure, possibly due to white coat syndrome. Home readings average 138 mmHg. Not on antihypertensive medication. - Recheck blood pressure during the visit - Encourage home blood pressure monitoring      Glenford Bayley, NP 12/05/2023

## 2023-12-05 NOTE — Patient Instructions (Addendum)
 -  PULMONARY EMBOLISM WITH BONE CEMENT EMBOLIZATION: A pulmonary embolism is a blockage in one of the pulmonary arteries in your lungs, in your case caused by bone cement used during your kyphoplasty. You completed six months of treatment with Eliquis and are now on aspirin. Since you have no significant chest pain or worsening shortness of breath, it is unlikely that the embolism has recurred. We will order a chest x-ray to check the status of your pulmonary artery and consider a CT scan if there are any abnormalities or new symptoms.  -BACK PAIN POST-KYPHOPLASTY: Your intermittent back pain is likely related to your previous kyphoplasty. You are receiving sacroiliac joint injections for pain management, and you should continue with these injections as scheduled.  -HYPERTENSION: Hypertension is high blood pressure. Your home readings average around 138 mmHg, which is slightly elevated. We will recheck your blood pressure during this visit and encourage you to continue monitoring it at home.  Orders: Recheck BP CXR  Follow-up As needed

## 2023-12-05 NOTE — Progress Notes (Signed)
 No significant interval change since last CXR. Exam was benign. No concerning clinical symptoms  S/p kyphoplasty (back surgery), cement again seen along upper lumbar spine. Pulmonary arterial cement again suggested at the right hilum.   Retrieval not recommended due to increased pulmonary risk

## 2023-12-06 DIAGNOSIS — I788 Other diseases of capillaries: Secondary | ICD-10-CM | POA: Diagnosis not present

## 2023-12-06 DIAGNOSIS — L821 Other seborrheic keratosis: Secondary | ICD-10-CM | POA: Diagnosis not present

## 2023-12-06 DIAGNOSIS — L570 Actinic keratosis: Secondary | ICD-10-CM | POA: Diagnosis not present

## 2023-12-06 DIAGNOSIS — L820 Inflamed seborrheic keratosis: Secondary | ICD-10-CM | POA: Diagnosis not present

## 2023-12-09 ENCOUNTER — Other Ambulatory Visit: Payer: Self-pay | Admitting: Neurological Surgery

## 2023-12-09 ENCOUNTER — Ambulatory Visit
Admission: RE | Admit: 2023-12-09 | Discharge: 2023-12-09 | Disposition: A | Discharge status: Home / Self Care | Source: Ambulatory Visit | Attending: Surgery | Admitting: Surgery

## 2023-12-09 ENCOUNTER — Ambulatory Visit
Admission: RE | Admit: 2023-12-09 | Discharge: 2023-12-09 | Disposition: A | Discharge status: Home / Self Care | Source: Ambulatory Visit | Attending: Neurological Surgery | Admitting: Neurological Surgery

## 2023-12-09 DIAGNOSIS — M461 Sacroiliitis, not elsewhere classified: Secondary | ICD-10-CM

## 2023-12-09 MED ORDER — IOPAMIDOL (ISOVUE-M 200) INJECTION 41%: 10.0000 mL | Freq: Once | INTRAMUSCULAR | Status: DC | PRN

## 2023-12-09 MED ORDER — METHYLPREDNISOLONE ACETATE 40 MG/ML INJ SUSP (RADIOLOG
80.0000 mg | Freq: Once | INTRAMUSCULAR | Status: DC
Start: 2023-12-09 — End: 2023-12-10

## 2024-01-05 ENCOUNTER — Other Ambulatory Visit: Payer: Self-pay | Admitting: Obstetrics and Gynecology

## 2024-01-05 DIAGNOSIS — Z1231 Encounter for screening mammogram for malignant neoplasm of breast: Secondary | ICD-10-CM

## 2024-02-09 ENCOUNTER — Ambulatory Visit
Admission: RE | Admit: 2024-02-09 | Discharge: 2024-02-09 | Disposition: A | Discharge status: Home / Self Care | Source: Ambulatory Visit | Attending: Obstetrics and Gynecology | Admitting: Obstetrics and Gynecology

## 2024-02-09 DIAGNOSIS — Z1231 Encounter for screening mammogram for malignant neoplasm of breast: Secondary | ICD-10-CM

## 2024-03-27 DIAGNOSIS — H18892 Other specified disorders of cornea, left eye: Secondary | ICD-10-CM | POA: Diagnosis not present

## 2024-05-13 ENCOUNTER — Ambulatory Visit
Admission: EM | Admit: 2024-05-13 | Discharge: 2024-05-13 | Disposition: A | Discharge status: Home / Self Care | Attending: Family Medicine | Admitting: Family Medicine

## 2024-05-13 ENCOUNTER — Encounter: Payer: Self-pay | Admitting: Emergency Medicine

## 2024-05-13 DIAGNOSIS — N3001 Acute cystitis with hematuria: Secondary | ICD-10-CM | POA: Diagnosis not present

## 2024-05-13 DIAGNOSIS — R300 Dysuria: Secondary | ICD-10-CM | POA: Diagnosis present

## 2024-05-13 LAB — POCT URINALYSIS DIP (MANUAL ENTRY)
Bilirubin, UA: NEGATIVE
Glucose, UA: NEGATIVE mg/dL
Ketones, POC UA: NEGATIVE mg/dL
Nitrite, UA: POSITIVE — AB
Protein Ur, POC: 30 mg/dL — AB
Spec Grav, UA: 1.015 (ref 1.010–1.025)
Urobilinogen, UA: 0.2 U/dL
pH, UA: 6.5 (ref 5.0–8.0)

## 2024-05-13 MED ORDER — CEFDINIR 300 MG PO CAPS: 300.0000 mg | ORAL_CAPSULE | Freq: Two times a day (BID) | ORAL | 0 refills | Status: AC

## 2024-05-13 NOTE — ED Provider Notes (Signed)
 [facility]    CSN: [id number] Arrival date & time: 05/13/24  1338      History   Chief Complaint Chief Complaint  Patient presents with   Dysuria    HPI Nancy Anthony is a 73 y.o. female.   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   History of kyphoplasty 09/12/2021   SIRS (systemic inflammatory response syndrome) (HCC) 09/12/2021   Chronic migraine without aura, intractable, with status migrainosus 08/24/2017   Hypothyroidism 03/02/2012   Hx of migraine headaches 03/02/2012   Allergic rhinitis 03/02/2012   Dyslipidemia 03/02/2012    Past Surgical History:  Procedure Laterality Date   BREAST BIOPSY     BREAST EXCISIONAL BIOPSY Right 25+ yrs ago   benign   BREAST EXCISIONAL BIOPSY Left 25+ yrs ago   benign   CHOLECYSTECTOMY N/A 11/15/2017   Procedure: LAPAROSCOPIC CHOLECYSTECTOMY WITH INTRAOPERATIVE CHOLANGIOGRAM ERAS PATHWAY;  Surgeon: Belinda Cough, MD;  Location: [facility] OR;  Service: General;  Laterality: N/A;   COLONOSCOPY     CYST REMOVAL L EAR Left 1997   DIAGNOSTIC LAPAROSCOPY     KYPHOPLASTY N/A 09/03/2021   Procedure: LUMBAR 2 KYPHOPLASTY;  Surgeon: Beuford Anes, MD;  Location: [facility] OR;  Service: Orthopedics;  Laterality: N/A;   THROMBECTOMY ILIAC ARTERY Right 09/14/2021   Procedure: THROMBECTOMY PULMONARY ARTERY;  Surgeon: Lanis Fonda BRAVO, MD;  Location: [facility] OR;  Service: Vascular;  Laterality: Right;   ULTRASOUND GUIDANCE FOR VASCULAR ACCESS Bilateral 09/14/2021   Procedure: ULTRASOUND GUIDANCE FOR VASCULAR ACCESS;  Surgeon: Lanis Fonda BRAVO, MD;  Location: [facility] OR;  Service: Vascular;  Laterality: Bilateral;    OB History    No obstetric history on file.      Home Medications    Prior to Admission medications   Medication Sig Start Date End Date Taking? Authorizing Provider  acetaminophen  (TYLENOL ) 500 MG tablet Take 1,000 mg by mouth every 6 (six) hours as needed for moderate pain.   Yes [provider]  aspirin  EC 81 MG tablet Take 81 mg by mouth daily. Swallow whole.   Yes [provider]  Calcium -Magnesium-Vitamin D (CALCIUM  MAGNESIUM PO) Take 1 tablet by mouth daily.   Yes [provider]  cefdinir  (OMNICEF ) 300 MG capsule Take 1 capsule (300 mg total) by mouth 2 (two) times daily for 7 days. 05/13/24 05/20/24 Yes Teddy Sharper, FNP  Cholecalciferol (VITAMIN D) 125 MCG (5000 UT) CAPS Take 5,000 Units by mouth daily.   Yes [provider]  cyclobenzaprine  (FLEXERIL ) 10 MG tablet Take 10 mg by mouth as needed for muscle spasms (1-2 tablets by mouth prn).   Yes [provider]  EMGALITY 120 MG/ML SOAJ Inject into the skin every 30 (thirty) days. 04/29/22  Yes [provider]  Folic Acid-Vit B6-Vit B12 (FOLBEE) 2.5-25-1 MG TABS tablet Take 1 tablet by mouth daily.   Yes [provider]  frovatriptan  (FROVA ) 2.5 MG tablet Take 2.5 mg by mouth as needed for migraine.   Yes [provider]  NYSTATIN powder Apply 1 application topically 2 (two) times daily as  needed. 04/19/21  Yes [provider]  perphenazine  (TRILAFON ) 4 MG tablet Take 2 mg by mouth as needed for migraine. PRN every 2 hours 05/30/19  Yes [provider]  SYNTHROID  100 MCG tablet Take 100 mcg by mouth every morning.   Yes [provider]    Family History Family History  Problem Relation Age of Onset   Hyperthyroidism Mother    Hypothyroidism Father    Migraines Father    Prostate cancer Brother    Hypothyroidism Brother     Social History Social History   Tobacco Use   Smoking status: Former    Current packs/day: 0.00    Types: Cigarettes     Quit date: 12/1988    Years since quitting: 35.4   Smokeless tobacco: Never  Vaping Use   Vaping status: Never Used  Substance Use Topics   Alcohol use: No    Comment: quit 1996   Drug use: No     Allergies   Leqvio  [inclisiran sodium ], Citric acid, Codeine, Other, Statins, Tape, Epinephrine , and Sulfa antibiotics   Review of Systems Review of Systems  Genitourinary:  Positive for dysuria and frequency.     Physical Exam Triage Vital Signs ED Triage Vitals  Encounter Vitals Group     BP      Girls Systolic BP Percentile      Girls Diastolic BP Percentile      Boys Systolic BP Percentile      Boys Diastolic BP Percentile      Pulse      Resp      Temp      Temp src      SpO2      Weight      Height      Head Circumference      Peak Flow      Pain Score      Pain Loc      Pain Education      Exclude from Growth Chart    No data found.  Updated Vital Signs BP (!) 170/79 (BP Location: Right Arm)   Pulse 86   Temp 98.1 F (36.7 C) (Oral)   Resp 18   SpO2 97%   Visual Acuity Right Eye Distance:   Left Eye Distance:   Bilateral Distance:    Right Eye Near:   Left Eye Near:    Bilateral Near:     Physical Exam Vitals and nursing note reviewed.  Constitutional:      Appearance: Normal appearance. She is normal weight.  HENT:     Head: Normocephalic and atraumatic.     Mouth/Throat:     Mouth: Mucous membranes are moist.     Pharynx: Oropharynx is clear.  Eyes:     Extraocular Movements: Extraocular movements intact.     Pupils: Pupils are equal, round, and reactive to light.  Cardiovascular:     Rate and Rhythm: Normal rate and regular rhythm.     Pulses: Normal pulses.     Heart sounds: Normal heart sounds.  Pulmonary:     Effort: Pulmonary effort is normal.     Breath sounds: Normal breath sounds. No wheezing, rhonchi or rales.  Musculoskeletal:        General: Normal range of motion.  Skin:    General: Skin is warm and dry.   Neurological:     General: No focal deficit present.     Mental Status: She is alert and oriented to person, place,  and time. Mental status is at baseline.  Psychiatric:        Mood and Affect: Mood normal.        Behavior: Behavior normal.      UC Treatments / Results  Labs (all labs ordered are listed, but only abnormal results are displayed) Labs Reviewed  POCT URINALYSIS DIP (MANUAL ENTRY) - Abnormal; Notable for the following components:      Result Value   Clarity, UA cloudy (*)    Blood, UA small (*)    Protein Ur, POC =30 (*)    Nitrite, UA Positive (*)    Leukocytes, UA Large (3+) (*)    All other components within normal limits  URINE CULTURE    EKG   Radiology No results found.  Procedures Procedures (including critical care time)  Medications Ordered in UC Medications - No data to display  Initial Impression / Assessment and Plan / UC Course  I have reviewed the triage vital signs and the nursing notes.  Pertinent labs & imaging results that were available during my care of the patient were reviewed by me and considered in my medical decision making (see chart for details).     MDM: 1.  Acute cystitis with hematuria-Rx'd cefdinir  300 mg capsule: Take 1 capsule twice daily x 7 days, UA revealed above, urine culture ordered. Advised patient take medication as directed with food to completion.  Encouraged to increase daily water  intake to 64 ounces per day while taking this medication.  Advised if symptoms worsen and/or unresolved please follow-up with your PCP, [facility] urology, or here for further evaluation.  Advised we will follow-up with urine culture results once received. Final Clinical Impressions(s) / UC Diagnoses   Final diagnoses:  Acute cystitis with hematuria     Discharge Instructions      Advised patient take medication as directed with food to completion.  Encouraged to increase daily water  intake to 64 ounces per day while taking  this medication.  Advised if symptoms worsen and/or unresolved please follow-up with your PCP, [facility] urology, or here for further evaluation.  Advised we will follow-up with urine culture results once received.     ED Prescriptions     Medication Sig Dispense Auth. Provider   cefdinir  (OMNICEF ) 300 MG capsule Take 1 capsule (300 mg total) by mouth 2 (two) times daily for 7 days. 14 capsule Jovonte Commins, FNP      PDMP not reviewed this encounter.   Teddy Sharper, FNP 05/13/24 1438

## 2024-05-13 NOTE — ED Triage Notes (Signed)
 Patient presents to Urgent Care with complaints of urine frequency, urgency, dysuria since 1 week ago. Patient reports started taking Azo on Thursday. Last night started having back pain and pelvic pain. Took Advil pm and Tylenol  for pain. Having some low grade fever and chills.

## 2024-05-13 NOTE — Discharge Instructions (Addendum)
 Advised patient take medication as directed with food to completion.  Encouraged to increase daily water  intake to 64 ounces per day while taking this medication.  Advised if symptoms worsen and/or unresolved please follow-up with your PCP, [facility] urology, or here for further evaluation.  Advised we will follow-up with urine culture results once received.

## 2024-05-14 ENCOUNTER — Telehealth: Payer: Self-pay

## 2024-05-14 NOTE — Telephone Encounter (Signed)
Called to check on patient. No answer. Left voicemail.

## 2024-05-15 ENCOUNTER — Ambulatory Visit (HOSPITAL_COMMUNITY): Payer: Self-pay

## 2024-05-15 LAB — URINE CULTURE: Culture: >=100000 — AB

## 2024-05-28 DIAGNOSIS — G43719 Chronic migraine without aura, intractable, without status migrainosus: Secondary | ICD-10-CM | POA: Diagnosis not present

## 2024-09-11 ENCOUNTER — Telehealth (HOSPITAL_COMMUNITY): Payer: Self-pay | Admitting: Pharmacist

## 2024-09-11 NOTE — Addendum Note (Signed)
 Addended by: DAYNE SHERRY RAMAN on: 09/11/2024 12:58 PM   Modules accepted: Orders

## 2024-09-11 NOTE — Telephone Encounter (Signed)
 Leqvio  treatment plan discontinued due to allergic reaction to treatment  Bonny Vanleeuwen, PharmD, MPH, BCPS, CPP Clinical Pharmacist
